# Patient Record
Sex: Female | Born: 1970 | Race: Black or African American | Hispanic: No | Marital: Married | Smoking: Never smoker
Health system: Southern US, Community
[De-identification: ages and names within clinical notes are randomized; demographics above are authoritative.]

## PROBLEM LIST (undated history)

## (undated) DIAGNOSIS — I10 Essential (primary) hypertension: Secondary | ICD-10-CM

## (undated) HISTORY — PX: ABDOMINAL HYSTERECTOMY: SHX81

## (undated) HISTORY — PX: TOTAL ABDOMINAL HYSTERECTOMY: SHX209

---

## 1998-04-16 ENCOUNTER — Ambulatory Visit (HOSPITAL_COMMUNITY): Admission: RE | Admit: 1998-04-16 | Discharge: 1998-04-16 | Payer: Self-pay | Admitting: Obstetrics and Gynecology

## 2000-01-28 ENCOUNTER — Emergency Department (HOSPITAL_COMMUNITY): Admission: EM | Admit: 2000-01-28 | Discharge: 2000-01-28 | Payer: Self-pay | Admitting: Emergency Medicine

## 2000-03-16 ENCOUNTER — Other Ambulatory Visit: Admission: RE | Admit: 2000-03-16 | Discharge: 2000-03-16 | Payer: Self-pay | Admitting: Obstetrics and Gynecology

## 2001-05-07 ENCOUNTER — Other Ambulatory Visit: Admission: RE | Admit: 2001-05-07 | Discharge: 2001-05-07 | Payer: Self-pay | Admitting: Obstetrics and Gynecology

## 2001-12-27 ENCOUNTER — Emergency Department (HOSPITAL_COMMUNITY): Admission: EM | Admit: 2001-12-27 | Discharge: 2001-12-27 | Payer: Self-pay | Admitting: Emergency Medicine

## 2004-10-29 ENCOUNTER — Other Ambulatory Visit: Admission: RE | Admit: 2004-10-29 | Discharge: 2004-10-29 | Payer: Self-pay | Admitting: Gynecology

## 2004-10-31 ENCOUNTER — Emergency Department (HOSPITAL_COMMUNITY): Admission: EM | Admit: 2004-10-31 | Discharge: 2004-10-31 | Payer: Self-pay | Admitting: Emergency Medicine

## 2005-05-29 ENCOUNTER — Emergency Department (HOSPITAL_COMMUNITY): Admission: EM | Admit: 2005-05-29 | Discharge: 2005-05-29 | Payer: Self-pay | Admitting: Emergency Medicine

## 2005-09-12 ENCOUNTER — Other Ambulatory Visit: Admission: RE | Admit: 2005-09-12 | Discharge: 2005-09-12 | Payer: Self-pay | Admitting: Family Medicine

## 2005-11-01 ENCOUNTER — Other Ambulatory Visit: Admission: RE | Admit: 2005-11-01 | Discharge: 2005-11-01 | Payer: Self-pay | Admitting: Gynecology

## 2006-01-19 ENCOUNTER — Ambulatory Visit: Payer: Self-pay | Admitting: Oncology

## 2006-01-31 LAB — CBC WITH DIFFERENTIAL/PLATELET
BASO%: 0.5 % (ref 0.0–2.0)
Basophils Absolute: 0 10*3/uL (ref 0.0–0.1)
EOS%: 2.4 % (ref 0.0–7.0)
Eosinophils Absolute: 0.2 10*3/uL (ref 0.0–0.5)
HCT: 29.3 % — ABNORMAL LOW (ref 34.8–46.6)
HGB: 9.2 g/dL — ABNORMAL LOW (ref 11.6–15.9)
LYMPH%: 32.9 % (ref 14.0–48.0)
MCH: 22.7 pg — ABNORMAL LOW (ref 26.0–34.0)
MCHC: 31.3 g/dL — ABNORMAL LOW (ref 32.0–36.0)
MCV: 72.7 fL — ABNORMAL LOW (ref 81.0–101.0)
MONO#: 0.7 10*3/uL (ref 0.1–0.9)
MONO%: 10.1 % (ref 0.0–13.0)
NEUT#: 3.7 10*3/uL (ref 1.5–6.5)
NEUT%: 54.1 % (ref 39.6–76.8)
Platelets: 543 10*3/uL — ABNORMAL HIGH (ref 145–400)
RBC: 4.03 10*6/uL (ref 3.70–5.32)
RDW: 18.8 % — ABNORMAL HIGH (ref 11.3–14.5)
WBC: 6.8 10*3/uL (ref 3.9–10.0)
lymph#: 2.2 10*3/uL (ref 0.9–3.3)

## 2006-01-31 LAB — COMPREHENSIVE METABOLIC PANEL
ALT: 13 U/L (ref 0–40)
AST: 14 U/L (ref 0–37)
Albumin: 3.9 g/dL (ref 3.5–5.2)
Alkaline Phosphatase: 65 U/L (ref 39–117)
BUN: 11 mg/dL (ref 6–23)
CO2: 25 mEq/L (ref 19–32)
Calcium: 8.4 mg/dL (ref 8.4–10.5)
Chloride: 105 mEq/L (ref 96–112)
Creatinine, Ser: 0.7 mg/dL (ref 0.4–1.2)
Glucose, Bld: 113 mg/dL — ABNORMAL HIGH (ref 70–99)
Potassium: 4.2 mEq/L (ref 3.5–5.3)
Sodium: 138 mEq/L (ref 135–145)
Total Bilirubin: 0.3 mg/dL (ref 0.3–1.2)
Total Protein: 7 g/dL (ref 6.0–8.3)

## 2006-01-31 LAB — FERRITIN: Ferritin: 3 ng/mL — ABNORMAL LOW (ref 10–291)

## 2006-01-31 LAB — FOLATE: Folate: 6.4 ng/mL

## 2006-01-31 LAB — IRON AND TIBC
%SAT: 4 % — ABNORMAL LOW (ref 20–55)
Iron: 14 ug/dL — ABNORMAL LOW (ref 42–145)
TIBC: 395 ug/dL (ref 250–470)
UIBC: 381 ug/dL

## 2006-01-31 LAB — CHCC SMEAR

## 2006-03-07 ENCOUNTER — Ambulatory Visit: Payer: Self-pay | Admitting: Oncology

## 2006-03-09 ENCOUNTER — Ambulatory Visit: Payer: Self-pay | Admitting: Oncology

## 2006-03-15 LAB — CBC WITH DIFFERENTIAL/PLATELET
BASO%: 0.5 % (ref 0.0–2.0)
Basophils Absolute: 0 10*3/uL (ref 0.0–0.1)
EOS%: 2.5 % (ref 0.0–7.0)
Eosinophils Absolute: 0.2 10*3/uL (ref 0.0–0.5)
HCT: 37.3 % (ref 34.8–46.6)
HGB: 12 g/dL (ref 11.6–15.9)
LYMPH%: 33.5 % (ref 14.0–48.0)
MCH: 25.9 pg — ABNORMAL LOW (ref 26.0–34.0)
MCHC: 32.2 g/dL (ref 32.0–36.0)
MCV: 80.3 fL — ABNORMAL LOW (ref 81.0–101.0)
MONO#: 0.6 10*3/uL (ref 0.1–0.9)
MONO%: 9.2 % (ref 0.0–13.0)
NEUT#: 3.7 10*3/uL (ref 1.5–6.5)
NEUT%: 54.3 % (ref 39.6–76.8)
Platelets: 455 10*3/uL — ABNORMAL HIGH (ref 145–400)
RBC: 4.64 10*6/uL (ref 3.70–5.32)
RDW: 27.9 % — ABNORMAL HIGH (ref 11.3–14.5)
WBC: 6.8 10*3/uL (ref 3.9–10.0)
lymph#: 2.3 10*3/uL (ref 0.9–3.3)

## 2006-03-15 LAB — FERRITIN: Ferritin: 69 ng/mL (ref 10–291)

## 2006-03-15 LAB — IRON AND TIBC
%SAT: 20 % (ref 20–55)
Iron: 71 ug/dL (ref 42–145)
TIBC: 360 ug/dL (ref 250–470)
UIBC: 289 ug/dL

## 2006-04-27 ENCOUNTER — Encounter (INDEPENDENT_AMBULATORY_CARE_PROVIDER_SITE_OTHER): Payer: Self-pay | Admitting: Specialist

## 2006-04-27 ENCOUNTER — Inpatient Hospital Stay (HOSPITAL_COMMUNITY): Admission: RE | Admit: 2006-04-27 | Discharge: 2006-04-29 | Payer: Self-pay | Admitting: Obstetrics

## 2009-06-18 ENCOUNTER — Emergency Department (HOSPITAL_BASED_OUTPATIENT_CLINIC_OR_DEPARTMENT_OTHER): Admission: EM | Admit: 2009-06-18 | Discharge: 2009-06-19 | Payer: Self-pay | Admitting: Emergency Medicine

## 2009-06-25 ENCOUNTER — Emergency Department (HOSPITAL_BASED_OUTPATIENT_CLINIC_OR_DEPARTMENT_OTHER): Admission: EM | Admit: 2009-06-25 | Discharge: 2009-06-25 | Payer: Self-pay | Admitting: Emergency Medicine

## 2009-11-01 ENCOUNTER — Observation Stay (HOSPITAL_COMMUNITY): Admission: EM | Admit: 2009-11-01 | Discharge: 2009-11-01 | Payer: Self-pay | Admitting: Emergency Medicine

## 2011-01-21 LAB — CBC
HCT: 41.7 % (ref 36.0–46.0)
Hemoglobin: 13.9 g/dL (ref 12.0–15.0)
MCHC: 33.4 g/dL (ref 30.0–36.0)
MCV: 101.4 fL — ABNORMAL HIGH (ref 78.0–100.0)
Platelets: 328 10*3/uL (ref 150–400)
RBC: 4.11 MIL/uL (ref 3.87–5.11)
RDW: 11.7 % (ref 11.5–15.5)
WBC: 9.4 10*3/uL (ref 4.0–10.5)

## 2011-01-21 LAB — BASIC METABOLIC PANEL
BUN: 10 mg/dL (ref 6–23)
CO2: 29 mEq/L (ref 19–32)
Calcium: 9 mg/dL (ref 8.4–10.5)
Chloride: 104 mEq/L (ref 96–112)
Creatinine, Ser: 0.8 mg/dL (ref 0.4–1.2)
GFR calc Af Amer: >60 mL/min (ref >60)
GFR calc non Af Amer: >60 mL/min (ref >60)
Glucose, Bld: 158 mg/dL — ABNORMAL HIGH (ref 70–99)
Potassium: 3.6 mEq/L (ref 3.5–5.1)
Sodium: 142 mEq/L (ref 135–145)

## 2011-01-21 LAB — URINE MICROSCOPIC-ADD ON

## 2011-01-21 LAB — URINALYSIS, ROUTINE W REFLEX MICROSCOPIC
Bilirubin Urine: NEGATIVE
Glucose, UA: NEGATIVE mg/dL
Hgb urine dipstick: NEGATIVE
Ketones, ur: NEGATIVE mg/dL
Leukocytes, UA: NEGATIVE
Nitrite: NEGATIVE
Protein, ur: 100 mg/dL — AB
Specific Gravity, Urine: 1.022 (ref 1.005–1.030)
Urobilinogen, UA: 0.2 mg/dL (ref 0.0–1.0)
pH: 6.5 (ref 5.0–8.0)

## 2011-01-21 LAB — PREGNANCY, URINE: Preg Test, Ur: NEGATIVE

## 2011-03-04 NOTE — Op Note (Signed)
Christy Cunningham, Christy Cunningham NO.:  [id number]   MEDICAL RECORD NO.:  [id number]          PATIENT TYPE:  INP   LOCATION:  9399                          FACILITY:  [facility]   PHYSICIAN:  Charles A. Clearance Coots, M.D.DATE OF BIRTH:  [birth year]   DATE OF PROCEDURE:  04/27/2006  DATE OF DISCHARGE:                                 OPERATIVE REPORT   PREOPERATIVE DIAGNOSIS:  Symptomatic uterine fibroids.   POSTOPERATIVE DIAGNOSIS:  Symptomatic uterine fibroids with pelvic  adhesions.   PROCEDURE:  Total abdominal hysterectomy, lysis of pelvic adhesions.   SURGEON:  Coral Ceo, M.D.   ASSISTANT:  Antionette Char, M.D.   ANESTHESIA:  General.   ESTIMATED BLOOD LOSS:  200 mL.   COMPLICATIONS:  None.   Foley to gravity.   FINDINGS:  Uterine fibroids, paratubal and paraovarian adhesions.   OPERATION:  The patient was brought to operating room after satisfactory  general endotracheal anesthesia, the abdomen was prepped and draped in the  usual sterile fashion.  Indwelling Foley catheter was placed.  A  Pfannenstiel skin incision was made through the previous incision with a  scalpel down to the fascia.  Fascia was nicked in the midline and the  fascial incision was extended to left and to right with curved Mayo  scissors.  The superior and inferior fascial edges were taken off of the  rectus muscles both blunt sharp dissection.  Rectus muscles bluntly and  sharply divided in midline.  Peritoneum was entered digitally and was then  sharply incised superiorly and inferiorly being careful to avoid the urinary  bladder inferiorly.  O'Connor-O'Sullivan retractor was then placed in the  incision and the bowel was packed off.  The anterior posterior blades were  placed.  The uterus was elevated and multiple adhesions were noted between  the right ovary and the sidewall of the uterus and the broad ligament.  The  right fallopian tube was also adhered to the appendix.  The same was  noted  on the left side. The adhesions were carefully incised and the ovary was  dropped down in its normal anatomic position.  Kelly forceps were then  placed across the utero-ovarian and broad ligaments on each side.  The round  ligaments were then grasped with Kelly forceps, cut and suture ligated with  transfixion sutures of 0 Vicryl bilaterally.  Anterior peritoneum was then  incised and undermined and the urinary bladder was dropped down and away  from the lower uterine segment and cervix sharply.  The broad ligament was  then tented digitally creating a window.  Parametrial clamp was placed  across the utero-ovarian and broad ligaments and pedicles were cut and  suture ligated with transfixion suture of 0 Vicryl after a free tie of 0  Vicryl was placed and the hemostasis was excellent.  The same procedure was  then performed on the opposite side without complications.  The uterine  vessels were then skeletonized and crossclamped with parametrial clamps,  curved along with a straight parametrial clamp above the curved clamp to  prevent back bleeding.  The vessels were cut and suture ligated  with  transfixion sutures of 0 Vicryl.  The same procedure was performed on the  opposite side without complications and the cardinal ligaments were then  serially clamped, cut and suture ligated with transfixion sutures of 0  Vicryl bilaterally down to the uterosacral ligaments.  The uterosacral  ligaments were clamped with parametrial clamp bilaterally, cut and suture  ligated with transfixion sutures of 0 Vicryl and these sutures were held  with hemostat for future closure of the cuff. The vaginal cuff was then  crossclamped with parametrial clamps and the cervix was then cut away from  the vaginal cuff entirely and submitted along with the uterus to pathology  for evaluation.  The center of the vaginal cuff was held with straight  Kocher clamps.  Each corner of the vaginal cuff was suture  ligated with  transfixion sutures of 0 Vicryl.  The center of the vaginal cuff was closed  with interrupted sutures of 0 Vicryl.  Hemostasis was excellent.  Pelvic  cavity was then thoroughly irrigated with warm saline solution.  All clots  were removed.  The abdomen was then closed after removal of the packing and  retractor.  The peritoneum was grasped with Kelly forceps and was closed  with continuous suture of 2-0 Vicryl.  Fascia was closed with continuous  suture of 0 PDS from each corner to the center.  Subcutaneous tissue was  thoroughly irrigated with warm saline solution and all areas of subcutaneous  bleeding were coagulated with the Bovie.  The skin was then closed with  continuous subcuticular suture of 3-0 Monocryl.  Sterile bandage was applied  to the incision closure.  Surgical technician indicated that all needle,  sponge and instrument counts were correct.  The patient tolerated procedure  well and was transported to recovery in satisfactory condition.      Charles A. Clearance Coots, M.D.  Electronically Signed     CAH/MEDQ  D:  04/27/2006  T:  04/27/2006  Job:  [id number]

## 2011-03-04 NOTE — Discharge Summary (Signed)
NAMESHASTA, CHINN NO.:  [id number]   MEDICAL RECORD NO.:  [id number]          PATIENT TYPE:  INP   LOCATION:  9307                          FACILITY:  [facility]   PHYSICIAN:  Charles A. Clearance Coots, M.D.DATE OF BIRTH:  06-06-1971   DATE OF ADMISSION:  04/27/2006  DATE OF DISCHARGE:  04/29/2006                                 DISCHARGE SUMMARY   ADMITTING DIAGNOSIS:  Symptomatic uterine fibroids.   DISCHARGE DIAGNOSIS:  Symptomatic uterine fibroids status post total  abdominal hysterectomy and lysis of adhesions.   REASON FOR ADMISSION:  A 40 year old black female G1 with history of heavy  periods for the past two years. Fibroids were revealed on ultrasound. The  patient also has a history of severe anemia with hemoglobin down to 7-8 g  requiring iron dextran transfusion in the past. She tried medical therapy  and had no success, and now was desirous of definitive surgical therapy.   PAST SURGICAL HISTORY:  Tubal ligation, tubal reversal.   ILLNESSES:  Gastroesophageal reflux.   MEDICATIONS:  Iron dextran, Prilosec.   ALLERGIES:  No known drug allergies.   SOCIAL HISTORY:  Married. Negative tobacco, alcohol or recreational drug  use.   FAMILY HISTORY:  Negative for gynecologic cancer.   REVIEW OF SYSTEMS:  Unremarkable except for lower abdominal cramping.   PHYSICAL EXAMINATION:  GENERAL:  Well-nourished, well-developed female in no  acute distress.  VITAL SIGNS:  She is afebrile, vital signs stable.  LUNGS:  Clear to auscultation bilaterally.  HEART:  Regular rate and rhythm.  ABDOMEN:  Soft, nontender.  PELVIC:  Normal external female genitalia. Vaginal mucosa normal. Cervix is  nulliparous. 0 lesions at discharge. Uterus is about 14 weeks size, mid  position. There are no adnexal masses or tenderness appreciated.   ADMITTING LABORATORY VALUES:  Hemoglobin 12, hematocrit 36, white blood cell  count 7300, platelets 420,000. Coags were within normal  limits.  Comprehensive metabolic panel was within normal limits. Urinalysis was  within normal limits.   HOSPITAL COURSE:  The patient underwent a total abdominal hysterectomy and  lysis of adhesions on April 27, 2006.  There were no intraoperative  complications. Postoperative course was uncomplicated. The patient was  discharged home on postop day #2 in good condition.   DISCHARGE LABORATORY VALUES:  Hemoglobin 12, hematocrit 36.   MEDICATIONS:  Tylox and ibuprofen were prescribed for pain.   Routine written instructions were given for discharge after hysterectomy.  The patient is to call the office for follow-up appointment in two weeks.      Charles A. Clearance Coots, M.D.  Electronically Signed     CAH/MEDQ  D:  06/08/2006  T:  06/08/2006  Job:  [id number]

## 2011-08-15 ENCOUNTER — Other Ambulatory Visit: Payer: Self-pay | Admitting: Obstetrics

## 2011-08-15 DIAGNOSIS — Z1231 Encounter for screening mammogram for malignant neoplasm of breast: Secondary | ICD-10-CM

## 2011-09-13 ENCOUNTER — Ambulatory Visit (HOSPITAL_COMMUNITY)
Admission: RE | Admit: 2011-09-13 | Discharge: 2011-09-13 | Disposition: A | Discharge status: Home / Self Care | Payer: BC Managed Care – PPO | Source: Ambulatory Visit | Attending: Obstetrics | Admitting: Obstetrics

## 2011-09-13 DIAGNOSIS — Z1231 Encounter for screening mammogram for malignant neoplasm of breast: Secondary | ICD-10-CM | POA: Insufficient documentation

## 2012-04-11 ENCOUNTER — Ambulatory Visit: Payer: BC Managed Care – PPO | Admitting: Family Medicine

## 2012-05-01 ENCOUNTER — Ambulatory Visit (INDEPENDENT_AMBULATORY_CARE_PROVIDER_SITE_OTHER): Payer: BC Managed Care – PPO | Admitting: Family Medicine

## 2012-05-01 ENCOUNTER — Encounter: Payer: Self-pay | Admitting: Family Medicine

## 2012-05-01 VITALS — BP 109/72 | HR 84 | Temp 98.2°F | Ht 65.5 in | Wt 184.0 lb

## 2012-05-01 DIAGNOSIS — K219 Gastro-esophageal reflux disease without esophagitis: Secondary | ICD-10-CM

## 2012-05-01 DIAGNOSIS — E119 Type 2 diabetes mellitus without complications: Secondary | ICD-10-CM

## 2012-05-01 MED ORDER — DEXLANSOPRAZOLE 60 MG PO CPDR: 60.0000 mg | DELAYED_RELEASE_CAPSULE | Freq: Every day | ORAL | Status: DC

## 2012-05-01 MED ORDER — SITAGLIPTIN PHOS-METFORMIN HCL 50-1000 MG PO TABS: 1.0000 | ORAL_TABLET | Freq: Two times a day (BID) | ORAL | Status: DC

## 2012-05-01 NOTE — Assessment & Plan Note (Signed)
If sx's continue despite meds, may need GI referral.

## 2012-05-01 NOTE — Assessment & Plan Note (Signed)
Doing excellent.  Will attempt to wean her meds in 3 months if she continues to have such good glycemic control, continued weight loss.

## 2012-05-01 NOTE — Progress Notes (Signed)
  Subjective:    Patient ID: Christy Cunningham, female    DOB: August 30, 1971, 41 y.o.   MRN: [id number]  HPI Here today as new pt.  She has been diabetic x 2 years and she has been seen by Dr. Sharl Ma.  She is on Janumet twice daily and she had recent Hgb A1C of 5.6.  She is following a diet and exercising to hopefully be able to come off her medications.  She is without complaint today and just needs to get established with a primary care MD who can manage her DM. Also, has problems with GERD, which have not improved with weight loss.  Was using prilosec OTC and failed that. Past Medical History  Diagnosis Date  . Diabetes mellitus    Past Surgical History  Procedure Date  . Abdominal hysterectomy    Current Outpatient Prescriptions on File Prior to Visit  Medication Sig Dispense Refill  . dexlansoprazole (DEXILANT) 60 MG capsule Take 1 capsule (60 mg total) by mouth daily.  30 capsule  5  . sitaGLIPtan-metformin (JANUMET) 50-1000 MG per tablet Take 1 tablet by mouth 2 (two) times daily with a meal.  60 tablet  5   No Known Allergies Family History  Problem Relation Age of Onset  . Diabetes Sister    History   Social History  . Marital Status: Married    Spouse Name: N/A    Number of Children: N/A  . Years of Education: N/A   Occupational History  . Not on file.   Social History Main Topics  . Smoking status: Never Smoker   . Smokeless tobacco: Not on file  . Alcohol Use: No  . Drug Use: No  . Sexually Active: Not on file   Other Topics Concern  . Not on file   Social History Narrative  . No narrative on file     Review of Systems  Constitutional: Negative for fever and chills.  HENT: Negative for nosebleeds, congestion and rhinorrhea.   Eyes: Negative for visual disturbance.  Respiratory: Negative for chest tightness and shortness of breath.   Cardiovascular: Negative for chest pain.  Gastrointestinal: Negative for nausea, vomiting, abdominal pain, diarrhea,  constipation, blood in stool and abdominal distention.  Genitourinary: Negative for dysuria, frequency, vaginal bleeding and menstrual problem.  Musculoskeletal: Negative for arthralgias.  Skin: Negative for rash.  Neurological: Negative for dizziness and headaches.  Psychiatric/Behavioral: Negative for behavioral problems, disturbed wake/sleep cycle and dysphoric mood.  All other systems reviewed and are negative.       Objective:   Physical Exam  Vitals reviewed. Constitutional: She is oriented to person, place, and time. She appears well-developed and well-nourished.  HENT:  Head: Normocephalic and atraumatic.  Eyes: No scleral icterus.  Neck: Normal range of motion. No thyromegaly present.  Cardiovascular: Normal rate and regular rhythm.   Pulmonary/Chest: Effort normal and breath sounds normal.  Abdominal: Soft. She exhibits no distension. There is no tenderness.  Neurological: She is alert and oriented to person, place, and time.  Skin: Skin is warm and dry.  Psychiatric: She has a normal mood and affect.          Assessment & Plan:

## 2012-05-01 NOTE — Patient Instructions (Signed)

## 2012-10-15 ENCOUNTER — Other Ambulatory Visit: Payer: Self-pay | Admitting: Family Medicine

## 2012-10-15 DIAGNOSIS — Z1231 Encounter for screening mammogram for malignant neoplasm of breast: Secondary | ICD-10-CM

## 2012-10-26 ENCOUNTER — Ambulatory Visit (HOSPITAL_COMMUNITY)
Admission: RE | Admit: 2012-10-26 | Discharge: 2012-10-26 | Disposition: A | Discharge status: Home / Self Care | Payer: BC Managed Care – PPO | Source: Ambulatory Visit | Attending: Family Medicine | Admitting: Family Medicine

## 2012-10-26 DIAGNOSIS — Z1231 Encounter for screening mammogram for malignant neoplasm of breast: Secondary | ICD-10-CM

## 2013-03-08 ENCOUNTER — Ambulatory Visit (INDEPENDENT_AMBULATORY_CARE_PROVIDER_SITE_OTHER): Payer: BC Managed Care – PPO | Admitting: Family Medicine

## 2013-03-08 ENCOUNTER — Encounter: Payer: Self-pay | Admitting: Family Medicine

## 2013-03-08 VITALS — BP 123/85 | HR 87 | Ht 67.5 in | Wt 164.0 lb

## 2013-03-08 DIAGNOSIS — E119 Type 2 diabetes mellitus without complications: Secondary | ICD-10-CM

## 2013-03-08 LAB — POCT GLYCOSYLATED HEMOGLOBIN (HGB A1C): Hemoglobin A1C: 5.6

## 2013-03-08 MED ORDER — METFORMIN HCL 1000 MG PO TABS: 1000.0000 mg | ORAL_TABLET | Freq: Two times a day (BID) | ORAL | Status: DC

## 2013-03-08 NOTE — Addendum Note (Signed)
Addended byArlyss Repress on: 03/08/2013 09:29 AM   Modules accepted: Orders

## 2013-03-08 NOTE — Assessment & Plan Note (Addendum)
To return for fasting blood work urine microalbumin, retinal scan today.  Continue current diet and exercise plan.  She will decrease her metformin to 1000mg  daily and watch her BS.  May increase if needed.  Her A1C is 5.6 today.

## 2013-03-08 NOTE — Patient Instructions (Addendum)

## 2013-03-08 NOTE — Progress Notes (Signed)
  Subjective:    Patient ID: Christy Cunningham, female    DOB: [birth year], 42 y.o.   MRN: [id number]  Diabetes Pertinent negatives for diabetes include no chest pain.    Doing well.  Down another 20 lbs.  She is running 5 mi/day 3days/wk.  Watching diet.  She continues on Metformin 2000 mg/day.  She is working long hours and not eating as much as she should.  She is not fasting today, but will come in for fasting lipids.  She will get a retina scan this am.  Review of Systems  Constitutional: Negative for fever and chills.  Respiratory: Negative for shortness of breath.   Cardiovascular: Negative for chest pain and leg swelling.  Gastrointestinal: Negative for nausea, vomiting, abdominal pain, diarrhea and constipation.       Objective:   Physical Exam  Vitals reviewed. Constitutional: She is oriented to person, place, and time. She appears well-developed and well-nourished. No distress.  HENT:  Head: Normocephalic and atraumatic.  Eyes: No scleral icterus.  Neck: Neck supple. Carotid bruit is not present. No thyromegaly present.  Cardiovascular: Normal rate and regular rhythm.   No murmur heard. Pulmonary/Chest: Effort normal and breath sounds normal. She has no wheezes.  Abdominal: Soft. There is no tenderness.  Musculoskeletal: Normal range of motion. She exhibits no tenderness.  Neurological: She is alert and oriented to person, place, and time.  Skin: Skin is warm and dry. No rash noted.  Psychiatric: She has a normal mood and affect.    glycosylated hemoglobin 5.6       Assessment & Plan:

## 2013-10-03 ENCOUNTER — Other Ambulatory Visit: Payer: BC Managed Care – PPO

## 2013-10-03 DIAGNOSIS — E119 Type 2 diabetes mellitus without complications: Secondary | ICD-10-CM

## 2013-10-03 LAB — COMPREHENSIVE METABOLIC PANEL
ALT: 24 U/L (ref 0–35)
AST: 21 U/L (ref 0–37)
Albumin: 4 g/dL (ref 3.5–5.2)
Alkaline Phosphatase: 57 U/L (ref 39–117)
BUN: 9 mg/dL (ref 6–23)
CO2: 30 mEq/L (ref 19–32)
Calcium: 8.7 mg/dL (ref 8.4–10.5)
Chloride: 101 mEq/L (ref 96–112)
Creat: 0.68 mg/dL (ref 0.50–1.10)
Glucose, Bld: 114 mg/dL — ABNORMAL HIGH (ref 70–99)
Potassium: 4 mEq/L (ref 3.5–5.3)
Sodium: 139 mEq/L (ref 135–145)
Total Bilirubin: 0.6 mg/dL (ref 0.3–1.2)
Total Protein: 7 g/dL (ref 6.0–8.3)

## 2013-10-03 LAB — LIPID PANEL
Cholesterol: 118 mg/dL (ref 0–200)
HDL: 53 mg/dL (ref >39)
LDL Cholesterol: 55 mg/dL (ref 0–99)
Total CHOL/HDL Ratio: 2.2 Ratio
Triglycerides: 51 mg/dL (ref <150)
VLDL: 10 mg/dL (ref 0–40)

## 2013-10-03 NOTE — Progress Notes (Signed)
CMP ,FLP AND MICRO URINE DONE TODAY Christy Cunningham

## 2013-10-04 LAB — MICROALBUMIN / CREATININE URINE RATIO
Creatinine, Urine: 49.5 mg/dL
Microalb Creat Ratio: 10.1 mg/g (ref 0.0–30.0)
Microalb, Ur: 0.5 mg/dL (ref 0.00–1.89)

## 2014-03-17 ENCOUNTER — Other Ambulatory Visit (INDEPENDENT_AMBULATORY_CARE_PROVIDER_SITE_OTHER): Payer: BC Managed Care – PPO | Admitting: *Deleted

## 2014-03-17 DIAGNOSIS — N390 Urinary tract infection, site not specified: Secondary | ICD-10-CM

## 2014-03-17 LAB — POCT URINALYSIS DIP (DEVICE)
Bilirubin Urine: NEGATIVE
Glucose, UA: NEGATIVE mg/dL
Ketones, ur: NEGATIVE mg/dL
Nitrite: POSITIVE — AB
Protein, ur: 30 mg/dL — AB
Specific Gravity, Urine: 1.02 (ref 1.005–1.030)
Urobilinogen, UA: 0.2 mg/dL (ref 0.0–1.0)
pH: 6.5 (ref 5.0–8.0)

## 2014-03-17 MED ORDER — PHENAZOPYRIDINE HCL 200 MG PO TABS: 200.0000 mg | ORAL_TABLET | Freq: Three times a day (TID) | ORAL | Status: DC | PRN

## 2014-03-17 MED ORDER — NITROFURANTOIN MONOHYD MACRO 100 MG PO CAPS: 100.0000 mg | ORAL_CAPSULE | Freq: Two times a day (BID) | ORAL | Status: DC

## 2014-03-17 NOTE — Progress Notes (Signed)
Pt dropped of urine for urinalysis. Patient is symptomatic with dysuria. Tried Azo Standard and it is not helping. Dr. Reola Calkins gave order for Macrobid bid x7days. Also sent in Pyridium per protocol. Patient aware. Urine sent for culture.

## 2014-03-19 LAB — URINE CULTURE: Colony Count: >=100000

## 2014-04-01 ENCOUNTER — Encounter: Payer: Self-pay | Admitting: Family Medicine

## 2014-04-01 ENCOUNTER — Ambulatory Visit (INDEPENDENT_AMBULATORY_CARE_PROVIDER_SITE_OTHER): Payer: BC Managed Care – PPO | Admitting: Family Medicine

## 2014-04-01 VITALS — BP 125/82 | HR 76 | Temp 97.9°F | Wt 189.0 lb

## 2014-04-01 DIAGNOSIS — E119 Type 2 diabetes mellitus without complications: Secondary | ICD-10-CM

## 2014-04-01 DIAGNOSIS — G43909 Migraine, unspecified, not intractable, without status migrainosus: Secondary | ICD-10-CM

## 2014-04-01 LAB — LDL CHOLESTEROL, DIRECT: Direct LDL: 71 mg/dL

## 2014-04-01 LAB — BASIC METABOLIC PANEL
BUN: 9 mg/dL (ref 6–23)
CO2: 26 mEq/L (ref 19–32)
Calcium: 8.9 mg/dL (ref 8.4–10.5)
Chloride: 100 mEq/L (ref 96–112)
Creat: 0.71 mg/dL (ref 0.50–1.10)
Glucose, Bld: 127 mg/dL — ABNORMAL HIGH (ref 70–99)
Potassium: 4.3 mEq/L (ref 3.5–5.3)
Sodium: 136 mEq/L (ref 135–145)

## 2014-04-01 LAB — POCT GLYCOSYLATED HEMOGLOBIN (HGB A1C): Hemoglobin A1C: 6

## 2014-04-01 MED ORDER — METFORMIN HCL ER 500 MG PO TB24: 1000.0000 mg | ORAL_TABLET | Freq: Every day | ORAL | Status: DC

## 2014-04-01 NOTE — Assessment & Plan Note (Addendum)
A:  R sided headaches consistent with migraines.   P:  I  agree that regular exercise, healthy eating and regular sleep and work cycle is best preventative therapy.

## 2014-04-01 NOTE — Progress Notes (Signed)
   Subjective:    Patient ID: Christy Cunningham, female    DOB: 01/11/1971, 43 y.o.   MRN: [id number] CC: DM2 f/u and migraine  HPI 43 yo F presents for SD visit for the following:  1. DM2: compliant with metformin. Feeling fatigued. Would like to switch to XR. Interested in additional medication to help with weight loss if A1c not at goal. Denies vision changes, polydipsia, polyuria, tingling/numbness in extremities.   2. Migraine HA: R sided. None today. Increase frequency. Admits to stress, poor diet, lack of exercise. Take OTC NSAID migraine medication which relieves symptoms.   Soc hx: non smoker  Review of Systems As per HPI      Objective:   Physical Exam BP 125/82  Pulse 76  Temp(Src) 97.9 F (36.6 C) (Oral)  Wt 189 lb (85.73 kg) General appearance: alert, cooperative and no distress Head: Normocephalic, without obvious abnormality, atraumatic Eyes: conjunctivae/corneas clear. PERRL, EOM's intact.  Lungs: clear to auscultation bilaterally Heart: regular rate and rhythm, S1, S2 normal, no murmur, click, rub or gallop Extremities: extremities normal, atraumatic, no cyanosis or edema  Lab Results  Component Value Date   HGBA1C 6.0 04/01/2014        Assessment & Plan:

## 2014-04-01 NOTE — Patient Instructions (Signed)
Christy Cunningham,  Thank you for coming in today.  1. DM2: A1c at goal I have changed your metformin to XR 500 mg tablets, two nightly.  Get back into regular exercise.  You will be contacted with lab results.   2. Migraines:  I agree that regular exercise, healthy eating and regular sleep and work cycle is best preventative therapy.   Recommend f/u in 6 months for well controlled DM2. F/u sooner with Dr. Shawnie PonsPratt if migraines persist.   Dr. Armen PickupFunches

## 2014-04-01 NOTE — Assessment & Plan Note (Signed)
A1c at goal I have changed your metformin to XR 500 mg tablets, two nightly.  Get back into regular exercise.  You will be contacted with lab results- ACR, dLDL, BMP

## 2014-04-02 LAB — MICROALBUMIN / CREATININE URINE RATIO
Creatinine, Urine: 99.4 mg/dL
Microalb Creat Ratio: 5 mg/g (ref 0.0–30.0)
Microalb, Ur: 0.5 mg/dL (ref 0.00–1.89)

## 2014-04-03 ENCOUNTER — Encounter: Payer: Self-pay | Admitting: Family Medicine

## 2014-04-14 ENCOUNTER — Telehealth: Payer: Self-pay | Admitting: Family Medicine

## 2014-04-14 NOTE — Telephone Encounter (Signed)
Patient informed. She will make appointment.

## 2014-04-14 NOTE — Telephone Encounter (Signed)
I would prefer her come in for discussion of medication

## 2014-07-30 ENCOUNTER — Other Ambulatory Visit: Payer: Self-pay | Admitting: *Deleted

## 2014-07-30 DIAGNOSIS — E119 Type 2 diabetes mellitus without complications: Secondary | ICD-10-CM

## 2014-07-30 MED ORDER — METFORMIN HCL ER 500 MG PO TB24
1000.0000 mg | ORAL_TABLET | Freq: Two times a day (BID) | ORAL | Status: DC
Start: 2014-07-30 — End: 2014-10-22

## 2014-07-30 MED ORDER — METFORMIN HCL ER 500 MG PO TB24: 1000.0000 mg | ORAL_TABLET | Freq: Every day | ORAL | Status: DC

## 2014-07-30 NOTE — Telephone Encounter (Signed)
Pt needs a refill on this medication.  She has been taking 1,000mg  BID of metformin before seeing Dr. Armen PickupFunches and then it was changed to 500mg  BID.  She has been using up her refills quickly due to taking 4 pills a day of her current rx.  Can this be changed back?  Pt plans to make an appt at your next available day. Taiki Buckwalter,CMA

## 2014-07-30 NOTE — Addendum Note (Signed)
Addended by: Reva BoresPRATT, TANYA S on: 07/30/2014 03:39 PM   Modules accepted: Orders

## 2014-09-04 ENCOUNTER — Ambulatory Visit: Payer: BC Managed Care – PPO | Admitting: Family Medicine

## 2014-10-01 ENCOUNTER — Encounter: Payer: Self-pay | Admitting: *Deleted

## 2014-10-01 NOTE — Progress Notes (Signed)
Pt called clinic with symptoms of yeast infection, Diflucan called into pharmacy per standing protocol orders.

## 2014-10-22 ENCOUNTER — Ambulatory Visit (INDEPENDENT_AMBULATORY_CARE_PROVIDER_SITE_OTHER): Payer: BC Managed Care – PPO | Admitting: Family Medicine

## 2014-10-22 ENCOUNTER — Encounter: Payer: Self-pay | Admitting: Family Medicine

## 2014-10-22 VITALS — BP 111/75 | HR 77 | Temp 97.8°F | Ht 68.0 in | Wt 190.0 lb

## 2014-10-22 DIAGNOSIS — B3731 Acute candidiasis of vulva and vagina: Secondary | ICD-10-CM

## 2014-10-22 DIAGNOSIS — E663 Overweight: Secondary | ICD-10-CM

## 2014-10-22 DIAGNOSIS — B373 Candidiasis of vulva and vagina: Secondary | ICD-10-CM

## 2014-10-22 DIAGNOSIS — E559 Vitamin D deficiency, unspecified: Secondary | ICD-10-CM | POA: Diagnosis not present

## 2014-10-22 DIAGNOSIS — E119 Type 2 diabetes mellitus without complications: Secondary | ICD-10-CM | POA: Diagnosis not present

## 2014-10-22 LAB — POCT URINALYSIS DIPSTICK
Bilirubin, UA: NEGATIVE
Blood, UA: NEGATIVE
Glucose, UA: 500
Ketones, UA: >=160
Leukocytes, UA: NEGATIVE
Nitrite, UA: NEGATIVE
Protein, UA: NEGATIVE
Spec Grav, UA: 1.02
Urobilinogen, UA: 0.2
pH, UA: 6

## 2014-10-22 LAB — POCT GLYCOSYLATED HEMOGLOBIN (HGB A1C): Hemoglobin A1C: 8.9

## 2014-10-22 MED ORDER — NYSTATIN 100000 UNIT/GM EX CREA: 1.0000 "application " | TOPICAL_CREAM | Freq: Two times a day (BID) | CUTANEOUS | Status: DC

## 2014-10-22 MED ORDER — FLUCONAZOLE 150 MG PO TABS: 150.0000 mg | ORAL_TABLET | Freq: Every day | ORAL | Status: DC

## 2014-10-22 MED ORDER — SITAGLIPTIN PHOS-METFORMIN HCL 50-1000 MG PO TABS: 1.0000 | ORAL_TABLET | Freq: Two times a day (BID) | ORAL | Status: DC

## 2014-10-22 MED ORDER — PHENTERMINE HCL 37.5 MG PO CAPS: 37.5000 mg | ORAL_CAPSULE | ORAL | Status: DC

## 2014-10-22 MED ORDER — VITAMIN D3 1.25 MG (50000 UT) PO CAPS: 50000.0000 [IU] | ORAL_CAPSULE | ORAL | Status: DC

## 2014-10-22 NOTE — Patient Instructions (Addendum)
Basic Carbohydrate Counting for Diabetes Mellitus Carbohydrate counting is a method for keeping track of the amount of carbohydrates you eat. Eating carbohydrates naturally increases the level of sugar (glucose) in your blood, so it is important for you to know the amount that is okay for you to have in every meal. Carbohydrate counting helps keep the level of glucose in your blood within normal limits. The amount of carbohydrates allowed is different for every person. A dietitian can help you calculate the amount that is right for you. Once you know the amount of carbohydrates you can have, you can count the carbohydrates in the foods you want to eat. Carbohydrates are found in the following foods:  Grains, such as breads and cereals.  Dried beans and soy products.  Starchy vegetables, such as potatoes, peas, and corn.  Fruit and fruit juices.  Milk and yogurt.  Sweets and snack foods, such as cake, cookies, candy, chips, soft drinks, and fruit drinks. CARBOHYDRATE COUNTING There are two ways to count the carbohydrates in your food. You can use either of the methods or a combination of both. Reading the "Nutrition Facts" on Packaged Food The "Nutrition Facts" is an area that is included on the labels of almost all packaged food and beverages in the [place]. It includes the serving size of that food or beverage and information about the nutrients in each serving of the food, including the grams (g) of carbohydrate per serving.  Decide the number of servings of this food or beverage that you will be able to eat or drink. Multiply that number of servings by the number of grams of carbohydrate that is listed on the label for that serving. The total will be the amount of carbohydrates you will be having when you eat or drink this food or beverage. Learning Standard Serving Sizes of Food When you eat food that is not packaged or does not include "Nutrition Facts" on the label, you need to  measure the servings in order to count the amount of carbohydrates.A serving of most carbohydrate-rich foods contains about 15 g of carbohydrates. The following list includes serving sizes of carbohydrate-rich foods that provide 15 g ofcarbohydrate per serving:   1 slice of bread (1 oz) or 1 six-inch tortilla.    of a hamburger bun or English muffin.  4-6 crackers.   cup unsweetened dry cereal.    cup hot cereal.   cup rice or pasta.    cup mashed potatoes or  of a large baked potato.  1 cup fresh fruit or one small piece of fruit.    cup canned or frozen fruit or fruit juice.  1 cup milk.   cup plain fat-free yogurt or yogurt sweetened with artificial sweeteners.   cup cooked dried beans or starchy vegetable, such as peas, corn, or potatoes.  Decide the number of standard-size servings that you will eat. Multiply that number of servings by 15 (the grams of carbohydrates in that serving). For example, if you eat 2 cups of strawberries, you will have eaten 2 servings and 30 g of carbohydrates (2 servings x 15 g = 30 g). For foods such as soups and casseroles, in which more than one food is mixed in, you will need to count the carbohydrates in each food that is included. EXAMPLE OF CARBOHYDRATE COUNTING Sample Dinner  3 oz chicken breast.   cup of brown rice.   cup of corn.  1 cup milk.   1 cup strawberries with   sugar-free whipped topping.  Carbohydrate Calculation Step 1: Identify the foods that contain carbohydrates:   Rice.   Corn.   Milk.   Strawberries. Step 2:Calculate the number of servings eaten of each:   2 servings of rice.   1 serving of corn.   1 serving of milk.   1 serving of strawberries. Step 3: Multiply each of those number of servings by 15 g:   2 servings of rice x 15 g = 30 g.   1 serving of corn x 15 g = 15 g.   1 serving of milk x 15 g = 15 g.   1 serving of strawberries x 15 g = 15 g. Step 4: Add  together all of the amounts to find the total grams of carbohydrates eaten: 30 g + 15 g + 15 g + 15 g = 75 g. Document Released: 10/03/2005 Document Revised: 02/17/2014 Document Reviewed: 08/30/2013 ExitCare Patient Information 2015 [employer]. This information is not intended to replace advice given to you by your health care provider. Make sure you discuss any questions you have with your health care provider. Diabetes and Exercise Exercising regularly is important. It is not just about losing weight. It has many health benefits, such as:  Improving your overall fitness, flexibility, and endurance.  Increasing your bone density.  Helping with weight control.  Decreasing your body fat.  Increasing your muscle strength.  Reducing stress and tension.  Improving your overall health. People with diabetes who exercise gain additional benefits because exercise:  Reduces appetite.  Improves the body's use of blood sugar (glucose).  Helps lower or control blood glucose.  Decreases blood pressure.  Helps control blood lipids (such as cholesterol and triglycerides).  Improves the body's use of the hormone insulin by:  Increasing the body's insulin sensitivity.  Reducing the body's insulin needs.  Decreases the risk for heart disease because exercising:  Lowers cholesterol and triglycerides levels.  Increases the levels of good cholesterol (such as high-density lipoproteins [HDL]) in the body.  Lowers blood glucose levels. YOUR ACTIVITY PLAN  Choose an activity that you enjoy and set realistic goals. Your health care provider or diabetes educator can help you make an activity plan that works for you. Exercise regularly as directed by your health care provider. This includes:  Performing resistance training twice a week such as push-ups, sit-ups, lifting weights, or using resistance bands.  Performing 150 minutes of cardio exercises each week such as walking, running, or  playing sports.  Staying active and spending no more than 90 minutes at one time being inactive. Even short bursts of exercise are good for you. Three 10-minute sessions spread throughout the day are just as beneficial as a single 30-minute session. Some exercise ideas include:  Taking the dog for a walk.  Taking the stairs instead of the elevator.  Dancing to your favorite song.  Doing an exercise video.  Doing your favorite exercise with a friend. RECOMMENDATIONS FOR EXERCISING WITH TYPE 1 OR TYPE 2 DIABETES   Check your blood glucose before exercising. If blood glucose levels are greater than 240 mg/dL, check for urine ketones. Do not exercise if ketones are present.  Avoid injecting insulin into areas of the body that are going to be exercised. For example, avoid injecting insulin into:  The arms when playing tennis.  The legs when jogging.  Keep a record of:  Food intake before and after you exercise.  Expected peak times of insulin action.  Blood   glucose levels before and after you exercise.  The type and amount of exercise you have done.  Review your records with your health care provider. Your health care provider will help you to develop guidelines for adjusting food intake and insulin amounts before and after exercising.  If you take insulin or oral hypoglycemic agents, watch for signs and symptoms of hypoglycemia. They include:  Dizziness.  Shaking.  Sweating.  Chills.  Confusion.  Drink plenty of water while you exercise to prevent dehydration or heat stroke. Body water is lost during exercise and must be replaced.  Talk to your health care provider before starting an exercise program to make sure it is safe for you. Remember, almost any type of activity is better than none. Document Released: 12/24/2003 Document Revised: 02/17/2014 Document Reviewed: 03/12/2013 ExitCare Patient Information 2015 [employer]. This information is not intended to  replace advice given to you by your health care provider. Make sure you discuss any questions you have with your health care provider.  

## 2014-10-22 NOTE — Assessment & Plan Note (Signed)
Worsening control--have added back Januvia at previous dose.  To work on diet and exercise.

## 2014-10-22 NOTE — Assessment & Plan Note (Signed)
8 wks of aggressive repletion with f/u lab--then, may be on 800 u daily

## 2014-10-22 NOTE — Progress Notes (Signed)
   Subjective:    Patient ID: Christy Cunningham is a 44 y.o. female presenting with No chief complaint on file.  on 10/22/2014  HPI: Reports elevated BS.  Has yeast in the vulva.  Lacks willpower and is no longer exercising.  Notes increase stress at work.  Review of Systems  Constitutional: Negative for fever and chills.  Respiratory: Negative for shortness of breath.   Cardiovascular: Negative for chest pain.  Gastrointestinal: Negative for nausea, vomiting and abdominal pain.  Endocrine: Positive for polydipsia and polyuria.  Genitourinary: Positive for vaginal discharge and vaginal pain (itching). Negative for dysuria.  Skin: Negative for rash.  Psychiatric/Behavioral: Positive for dysphoric mood and decreased concentration.      Objective:    BP 111/75 mmHg  Pulse 77  Temp(Src) 97.8 F (36.6 C) (Oral)  Ht 5\' 8"  (1.727 m)  Wt 190 lb (86.183 kg)  BMI 28.90 kg/m2 Physical Exam  Constitutional: She is oriented to person, place, and time. She appears well-developed and well-nourished. No distress.  HENT:  Head: Normocephalic and atraumatic.  Eyes: No scleral icterus.  Neck: Neck supple.  Cardiovascular: Normal rate.   Pulmonary/Chest: Effort normal.  Abdominal: Soft.  Neurological: She is alert and oriented to person, place, and time.  Skin: Skin is warm and dry.  Psychiatric: She has a normal mood and affect.   HgbA1C-8.9 Vitamin D level is 12.5     Assessment & Plan:   Problem List Items Addressed This Visit      Unprioritized   Diabetes mellitus - Primary    Worsening control--have added back Januvia at previous dose.  To work on diet and exercise.    Relevant Medications      JANUMET 50-1000 MG PO TABS   Other Relevant Orders      POCT A1C (Completed)      Urinalysis Dipstick (Completed)   Vitamin D deficiency    8 wks of aggressive repletion with f/u lab--then, may be on 800 u daily    Relevant Medications      Cholecalciferol (VITAMIN D3) 50000 UNITS  CAPS    Other Visit Diagnoses    Vulvar candidiasis        Diflucan + Nystatin--should improve with glycemic control    Relevant Medications       fluconazole (DIFLUCAN) tablet 150 mg       Nystatin cream    Overweight (BMI 25.0-29.9)        Trial of phentermine to help with appetite suppression and kick start weight loss    Relevant Medications       phentermine capsule       Return in about 3 months (around 01/21/2015).

## 2014-12-15 ENCOUNTER — Encounter (HOSPITAL_BASED_OUTPATIENT_CLINIC_OR_DEPARTMENT_OTHER): Payer: Self-pay | Admitting: Emergency Medicine

## 2014-12-15 ENCOUNTER — Emergency Department (HOSPITAL_BASED_OUTPATIENT_CLINIC_OR_DEPARTMENT_OTHER)
Admission: EM | Admit: 2014-12-15 | Discharge: 2014-12-15 | Disposition: A | Discharge status: Home / Self Care | Payer: BLUE CROSS/BLUE SHIELD | Attending: Emergency Medicine | Admitting: Emergency Medicine

## 2014-12-15 DIAGNOSIS — E119 Type 2 diabetes mellitus without complications: Secondary | ICD-10-CM | POA: Insufficient documentation

## 2014-12-15 DIAGNOSIS — L988 Other specified disorders of the skin and subcutaneous tissue: Secondary | ICD-10-CM | POA: Diagnosis present

## 2014-12-15 DIAGNOSIS — Z79899 Other long term (current) drug therapy: Secondary | ICD-10-CM | POA: Insufficient documentation

## 2014-12-15 DIAGNOSIS — R238 Other skin changes: Secondary | ICD-10-CM

## 2014-12-15 NOTE — ED Notes (Signed)
Pt has blisters to both heels due to running marathons.  Pt asking to have them popped.  Some pain with wearing shoes.

## 2014-12-15 NOTE — ED Notes (Signed)
PA at bedside.

## 2014-12-15 NOTE — ED Provider Notes (Signed)
CSN: [id number]     Arrival date & time 12/15/14  1726 History   First MD Initiated Contact with Patient 12/15/14 1744     Chief Complaint  Patient presents with  . Blister     (Consider location/radiation/quality/duration/timing/severity/associated sxs/prior Treatment) Patient is a 44 y.o. female presenting with lower extremity pain. The history is provided by the patient. No language interpreter was used.  Foot Pain This is a new problem. The current episode started in the past 7 days. The problem occurs constantly. The problem has been gradually worsening. Pertinent negatives include no sore throat. Nothing aggravates the symptoms. She has tried nothing for the symptoms. The treatment provided moderate relief.  Pt reports she blistered heels running. Pt is diabetic.  Pt complains of pressure from blisters.  Pt wants areas drained  Past Medical History  Diagnosis Date  . Diabetes mellitus    Past Surgical History  Procedure Laterality Date  . Abdominal hysterectomy     Family History  Problem Relation Age of Onset  . Diabetes Sister    History  Substance Use Topics  . Smoking status: Never Smoker   . Smokeless tobacco: Not on file  . Alcohol Use: No   OB History    No data available     Review of Systems  HENT: Negative for sore throat.   All other systems reviewed and are negative.     Allergies  Review of patient's allergies indicates no known allergies.  Home Medications   Prior to Admission medications   Medication Sig Start Date End Date Taking? Authorizing Provider  Cholecalciferol (VITAMIN D3) 50000 UNITS CAPS Take 50,000 Units by mouth once a week. 10/22/14   Reva Boresanya S Pratt, MD  dexlansoprazole (DEXILANT) 60 MG capsule Take 1 capsule (60 mg total) by mouth daily. 05/01/12   Reva Boresanya S Pratt, MD  phentermine 37.5 MG capsule Take 1 capsule (37.5 mg total) by mouth every morning. 10/22/14   Reva Boresanya S Pratt, MD  sitaGLIPtin-metformin (JANUMET) 50-1000 MG per tablet  Take 1 tablet by mouth 2 (two) times daily with a meal. 10/22/14   Reva Boresanya S Pratt, MD   BP 121/70 mmHg  Pulse 66  Temp(Src) 98.4 F (36.9 C)  Resp 18  Ht 5\' 7"  (1.702 m)  Wt 193 lb (87.544 kg)  BMI 30.22 kg/m2  SpO2 100% Physical Exam  Musculoskeletal: She exhibits tenderness.  2cm blisters bilat feet outer aspects,   Neurological: She is alert.  Skin: Skin is warm.  Psychiatric: She has a normal mood and affect.    ED Course  Procedures (including critical care time) Labs Review Labs Reviewed - No data to display  Imaging Review No results found.   EKG Interpretation None      MDM  Pt counseled on blisters.  I advised to leave intact.    Final diagnoses:  Blisters of multiple sites        Elson AreasLeslie K Angelisse Riso, [place] JerseyPA-C 12/15/14 2309  Purvis SheffieldForrest Harrison, MD 12/16/14 1014

## 2014-12-15 NOTE — Discharge Instructions (Signed)
    Blisters Blisters are fluid-filled sacs that form within the skin. Common causes of blistering are friction, burns, and exposure to irritating chemicals. The fluid in the blister protects the underlying damaged skin. Most of the time it is not recommended that you open blisters. When a blister is opened, there is an increased chance for infection. Usually, a blister will open on its own. They then dry up and peel off within 10 days. If the blister is tense and uncomfortable (painful) the fluid may be drained. If it is drained the roof of the blister should be left intact. The draining should only be done by a medical professional under aseptic conditions. Poorly fitting shoes and boots can cause blisters by being too tight or too loose. Wearing extra socks or using tape, bandages, or pads over the blister-prone area helps prevent the problem by reducing friction. Blisters heal more slowly if you have diabetes or if you have problems with your circulation. You need to be careful about medical follow-up to prevent infection. HOME CARE INSTRUCTIONS  Protect areas where blisters have formed until the skin is healed. Use a special bandage with a hole cut in the middle around the blister. This reduces pressure and friction. When the blister breaks, trim off the loose skin and keep the area clean by washing it with soap daily. Soaking the blister or broken-open blister with diluted vinegar twice daily for 15 minutes will dry it up and speed the healing. Use 3 tablespoons of white vinegar per quart of water (45 mL white vinegar per liter of water). An antibiotic ointment and a bandage can be used to cover the area after soaking.  SEEK MEDICAL CARE IF:   You develop increased redness, pain, swelling, or drainage in the blistered area.  You develop a pus-like discharge from the blistered area, chills, or a fever. MAKE SURE YOU:   Understand these instructions.  Will watch your condition.  Will get help  right away if you are not doing well or get worse. Document Released: 11/10/2004 Document Revised: 12/26/2011 Document Reviewed: 10/08/2008 ExitCare Patient Information 2015 [employer]. This information is not intended to replace advice given to you by your health care provider. Make sure you discuss any questions you have with your health care provider.  

## 2014-12-15 NOTE — ED Notes (Signed)
Ginger ale offered for pt and family per request.  Pt ambulatory to restroom without difficulty.

## 2015-02-25 ENCOUNTER — Other Ambulatory Visit: Payer: Self-pay | Admitting: *Deleted

## 2015-02-25 DIAGNOSIS — E119 Type 2 diabetes mellitus without complications: Secondary | ICD-10-CM

## 2015-02-25 MED ORDER — SITAGLIPTIN PHOS-METFORMIN HCL 50-1000 MG PO TABS: 1.0000 | ORAL_TABLET | Freq: Two times a day (BID) | ORAL | Status: DC

## 2015-02-25 NOTE — Progress Notes (Signed)
Received call from patient with request for refill on diabetes medication until can be seen by Dr. Shawnie PonsPratt later in the month.  Refill sent to pharmacy.

## 2015-03-11 ENCOUNTER — Encounter: Payer: Self-pay | Admitting: Family Medicine

## 2015-03-11 ENCOUNTER — Ambulatory Visit (INDEPENDENT_AMBULATORY_CARE_PROVIDER_SITE_OTHER): Payer: BLUE CROSS/BLUE SHIELD | Admitting: Family Medicine

## 2015-03-11 VITALS — BP 121/78 | HR 76 | Temp 98.0°F | Ht 67.0 in | Wt 193.5 lb

## 2015-03-11 DIAGNOSIS — E119 Type 2 diabetes mellitus without complications: Secondary | ICD-10-CM | POA: Diagnosis not present

## 2015-03-11 DIAGNOSIS — E663 Overweight: Secondary | ICD-10-CM | POA: Diagnosis not present

## 2015-03-11 DIAGNOSIS — E559 Vitamin D deficiency, unspecified: Secondary | ICD-10-CM | POA: Diagnosis not present

## 2015-03-11 LAB — POCT GLYCOSYLATED HEMOGLOBIN (HGB A1C): Hemoglobin A1C: 6.9

## 2015-03-11 MED ORDER — PHENTERMINE HCL 37.5 MG PO CAPS: 37.5000 mg | ORAL_CAPSULE | ORAL | Status: DC

## 2015-03-11 MED ORDER — SITAGLIPTIN PHOS-METFORMIN HCL 50-1000 MG PO TABS: 1.0000 | ORAL_TABLET | Freq: Two times a day (BID) | ORAL | Status: DC

## 2015-03-11 NOTE — Patient Instructions (Signed)
Diabetes and Exercise Exercising regularly is important. It is not just about losing weight. It has many health benefits, such as:  Improving your overall fitness, flexibility, and endurance.  Increasing your bone density.  Helping with weight control.  Decreasing your body fat.  Increasing your muscle strength.  Reducing stress and tension.  Improving your overall health. People with diabetes who exercise gain additional benefits because exercise:  Reduces appetite.  Improves the body's use of blood sugar (glucose).  Helps lower or control blood glucose.  Decreases blood pressure.  Helps control blood lipids (such as cholesterol and triglycerides).  Improves the body's use of the hormone insulin by:  Increasing the body's insulin sensitivity.  Reducing the body's insulin needs.  Decreases the risk for heart disease because exercising:  Lowers cholesterol and triglycerides levels.  Increases the levels of good cholesterol (such as high-density lipoproteins [HDL]) in the body.  Lowers blood glucose levels. YOUR ACTIVITY PLAN  Choose an activity that you enjoy and set realistic goals. Your health care provider or diabetes educator can help you make an activity plan that works for you. Exercise regularly as directed by your health care provider. This includes:  Performing resistance training twice a week such as push-ups, sit-ups, lifting weights, or using resistance bands.  Performing 150 minutes of cardio exercises each week such as walking, running, or playing sports.  Staying active and spending no more than 90 minutes at one time being inactive. Even short bursts of exercise are good for you. Three 10-minute sessions spread throughout the day are just as beneficial as a single 30-minute session. Some exercise ideas include:  Taking the dog for a walk.  Taking the stairs instead of the elevator.  Dancing to your favorite song.  Doing an exercise  video.  Doing your favorite exercise with a friend. RECOMMENDATIONS FOR EXERCISING WITH TYPE 1 OR TYPE 2 DIABETES   Check your blood glucose before exercising. If blood glucose levels are greater than 240 mg/dL, check for urine ketones. Do not exercise if ketones are present.  Avoid injecting insulin into areas of the body that are going to be exercised. For example, avoid injecting insulin into:  The arms when playing tennis.  The legs when jogging.  Keep a record of:  Food intake before and after you exercise.  Expected peak times of insulin action.  Blood glucose levels before and after you exercise.  The type and amount of exercise you have done.  Review your records with your health care provider. Your health care provider will help you to develop guidelines for adjusting food intake and insulin amounts before and after exercising.  If you take insulin or oral hypoglycemic agents, watch for signs and symptoms of hypoglycemia. They include:  Dizziness.  Shaking.  Sweating.  Chills.  Confusion.  Drink plenty of water while you exercise to prevent dehydration or heat stroke. Body water is lost during exercise and must be replaced.  Talk to your health care provider before starting an exercise program to make sure it is safe for you. Remember, almost any type of activity is better than none. Document Released: 12/24/2003 Document Revised: 02/17/2014 Document Reviewed: 03/12/2013 ExitCare Patient Information 2015 [employer]. This information is not intended to replace advice given to you by your health care provider. Make sure you discuss any questions you have with your health care provider.  

## 2015-03-11 NOTE — Assessment & Plan Note (Signed)
Recheck levels 

## 2015-03-11 NOTE — Assessment & Plan Note (Addendum)
HgbA1C is 6.9--doing well --continue current regimen

## 2015-03-11 NOTE — Progress Notes (Signed)
    Subjective:    Patient ID: Christy Cunningham is a 44 y.o. female presenting with Follow-up and Medication Refill  on 03/11/2015  HPI: Pt is here for DM follow up.  Her HgbA1C is improved back on her diet. Wants to continue to work on weight loss. Needs vitamin D check.  Review of Systems  Constitutional: Negative for fever and chills.  Respiratory: Negative for shortness of breath.   Cardiovascular: Negative for chest pain.  Gastrointestinal: Negative for nausea, vomiting and abdominal pain.  Genitourinary: Negative for dysuria.  Skin: Negative for rash.      Objective:    BP 121/78 mmHg  Pulse 76  Temp(Src) 98 F (36.7 C) (Oral)  Ht 5\' 7"  (1.702 m)  Wt 193 lb 8 oz (87.771 kg)  BMI 30.30 kg/m2 Physical Exam  Constitutional: She is oriented to person, place, and time. She appears well-developed and well-nourished. No distress.  HENT:  Head: Normocephalic and atraumatic.  Eyes: No scleral icterus.  Neck: Neck supple.  Cardiovascular: Normal rate and regular rhythm.   No murmur heard. Pulmonary/Chest: Effort normal and breath sounds normal.  Abdominal: Soft. She exhibits no mass. There is no tenderness.  Neurological: She is alert and oriented to person, place, and time.  Skin: Skin is warm and dry.  Psychiatric: She has a normal mood and affect.        Assessment & Plan:   Problem List Items Addressed This Visit      Unprioritized   Diabetes mellitus - Primary    HgbA1C is 6.9--doing well --continue current regimen      Relevant Medications   sitaGLIPtin-metformin (JANUMET) 50-1000 MG per tablet   Other Relevant Orders   POCT glycosylated hemoglobin (Hb A1C) (Completed)   Vitamin D deficiency    Recheck levels      Relevant Orders   Vit D  25 hydroxy (rtn osteoporosis monitoring)    Other Visit Diagnoses    Overweight (BMI 25.0-29.9)        Trial of phentermine to help with appetite suppression and kick start weight loss    Relevant Medications    phentermine 37.5 MG capsule        Return in about 6 months (around 09/11/2015).  Adriann Thau S 03/11/2015 8:54 AM

## 2015-03-12 ENCOUNTER — Telehealth: Payer: Self-pay | Admitting: *Deleted

## 2015-03-12 LAB — VITAMIN D 25 HYDROXY (VIT D DEFICIENCY, FRACTURES): Vit D, 25-Hydroxy: 29 ng/mL — ABNORMAL LOW (ref 30–100)

## 2015-03-12 NOTE — Telephone Encounter (Signed)
-----   Message from Reva Boresanya S Pratt, MD sent at 03/12/2015  8:14 AM EDT ----- Vitamin d is low but not critical--continue replacement--may use over the counter 600 units/day should be enough

## 2015-03-12 NOTE — Telephone Encounter (Signed)
LM for patient ok per DPR. Christy Cunningham,CMA  

## 2015-07-15 ENCOUNTER — Other Ambulatory Visit: Payer: Self-pay | Admitting: Family Medicine

## 2015-07-15 DIAGNOSIS — E119 Type 2 diabetes mellitus without complications: Secondary | ICD-10-CM

## 2015-07-15 NOTE — Telephone Encounter (Signed)
Refill request for JANUMET.

## 2015-07-16 MED ORDER — SITAGLIPTIN PHOS-METFORMIN HCL 50-1000 MG PO TABS: 1.0000 | ORAL_TABLET | Freq: Two times a day (BID) | ORAL | Status: DC

## 2015-08-14 ENCOUNTER — Telehealth: Payer: Self-pay | Admitting: *Deleted

## 2015-08-14 NOTE — Telephone Encounter (Signed)
[facility] pharmacy called requesting to change Phentermine 37.5 mg capsule to tablets.  Verbal order given to change to tablets, the tablets are cheaper.  Clovis PuMartin, Andilyn Bettcher L, RN

## 2015-08-28 ENCOUNTER — Encounter: Payer: Self-pay | Admitting: *Deleted

## 2015-08-28 ENCOUNTER — Other Ambulatory Visit: Payer: BLUE CROSS/BLUE SHIELD

## 2015-08-28 DIAGNOSIS — R300 Dysuria: Secondary | ICD-10-CM

## 2015-08-28 DIAGNOSIS — N390 Urinary tract infection, site not specified: Secondary | ICD-10-CM

## 2015-08-28 LAB — POCT URINALYSIS DIP (DEVICE)
Bilirubin Urine: NEGATIVE
Glucose, UA: 100 mg/dL — AB
Ketones, ur: NEGATIVE mg/dL
Nitrite: POSITIVE — AB
Protein, ur: NEGATIVE mg/dL
Specific Gravity, Urine: 1.015 (ref 1.005–1.030)
Urobilinogen, UA: 1 mg/dL (ref 0.0–1.0)
pH: 6.5 (ref 5.0–8.0)

## 2015-08-28 MED ORDER — SULFAMETHOXAZOLE-TRIMETHOPRIM 800-160 MG PO TABS: 1.0000 | ORAL_TABLET | Freq: Two times a day (BID) | ORAL | Status: DC

## 2015-08-28 MED ORDER — PHENAZOPYRIDINE HCL 200 MG PO TABS: 200.0000 mg | ORAL_TABLET | Freq: Three times a day (TID) | ORAL | Status: DC | PRN

## 2015-08-28 NOTE — Progress Notes (Signed)
Patient in with complaints of UTI.  Urine culture sent.  Antibiotics ordered and sent to pharmacy.

## 2015-09-01 LAB — URINE CULTURE

## 2015-09-02 ENCOUNTER — Ambulatory Visit (INDEPENDENT_AMBULATORY_CARE_PROVIDER_SITE_OTHER): Payer: BLUE CROSS/BLUE SHIELD | Admitting: Family Medicine

## 2015-09-02 ENCOUNTER — Encounter: Payer: Self-pay | Admitting: Family Medicine

## 2015-09-02 VITALS — BP 124/64 | HR 60 | Ht 67.0 in | Wt 198.0 lb

## 2015-09-02 DIAGNOSIS — Z1239 Encounter for other screening for malignant neoplasm of breast: Secondary | ICD-10-CM | POA: Diagnosis not present

## 2015-09-02 DIAGNOSIS — E1169 Type 2 diabetes mellitus with other specified complication: Secondary | ICD-10-CM | POA: Diagnosis not present

## 2015-09-02 DIAGNOSIS — E119 Type 2 diabetes mellitus without complications: Secondary | ICD-10-CM | POA: Diagnosis not present

## 2015-09-02 DIAGNOSIS — T732XXA Exhaustion due to exposure, initial encounter: Secondary | ICD-10-CM | POA: Diagnosis not present

## 2015-09-02 DIAGNOSIS — K219 Gastro-esophageal reflux disease without esophagitis: Secondary | ICD-10-CM

## 2015-09-02 DIAGNOSIS — E669 Obesity, unspecified: Secondary | ICD-10-CM

## 2015-09-02 DIAGNOSIS — E559 Vitamin D deficiency, unspecified: Secondary | ICD-10-CM

## 2015-09-02 LAB — LIPID PANEL
Cholesterol: 128 mg/dL (ref 125–200)
HDL: 51 mg/dL (ref >=46)
LDL Cholesterol: 64 mg/dL (ref <130)
Total CHOL/HDL Ratio: 2.5 Ratio (ref <=5.0)
Triglycerides: 64 mg/dL (ref <150)
VLDL: 13 mg/dL (ref <30)

## 2015-09-02 LAB — TSH: TSH: 0.627 u[IU]/mL (ref 0.350–4.500)

## 2015-09-02 LAB — POCT GLYCOSYLATED HEMOGLOBIN (HGB A1C): Hemoglobin A1C: 7.9

## 2015-09-02 MED ORDER — OMEPRAZOLE MAGNESIUM 20 MG PO TBEC: 20.0000 mg | DELAYED_RELEASE_TABLET | Freq: Every day | ORAL | Status: DC

## 2015-09-02 MED ORDER — SITAGLIPTIN PHOS-METFORMIN HCL 50-1000 MG PO TABS: 1.0000 | ORAL_TABLET | Freq: Two times a day (BID) | ORAL | Status: DC

## 2015-09-02 NOTE — Patient Instructions (Signed)
Diabetes and Exercise Exercising regularly is important. It is not just about losing weight. It has many health benefits, such as:  Improving your overall fitness, flexibility, and endurance.  Increasing your bone density.  Helping with weight control.  Decreasing your body fat.  Increasing your muscle strength.  Reducing stress and tension.  Improving your overall health. People with diabetes who exercise gain additional benefits because exercise:  Reduces appetite.  Improves the body's use of blood sugar (glucose).  Helps lower or control blood glucose.  Decreases blood pressure.  Helps control blood lipids (such as cholesterol and triglycerides).  Improves the body's use of the hormone insulin by:  Increasing the body's insulin sensitivity.  Reducing the body's insulin needs.  Decreases the risk for heart disease because exercising:  Lowers cholesterol and triglycerides levels.  Increases the levels of good cholesterol (such as high-density lipoproteins [HDL]) in the body.  Lowers blood glucose levels. YOUR ACTIVITY PLAN  Choose an activity that you enjoy, and set realistic goals. To exercise safely, you should begin practicing any new physical activity slowly, and gradually increase the intensity of the exercise over time. Your health care provider or diabetes educator can help create an activity plan that works for you. General recommendations include:  Encouraging children to engage in at least 60 minutes of physical activity each day.  Stretching and performing strength training exercises, such as yoga or weight lifting, at least 2 times per week.  Performing a total of at least 150 minutes of moderate-intensity exercise each week, such as brisk walking or water aerobics.  Exercising at least 3 days per week, making sure you allow no more than 2 consecutive days to pass without exercising.  Avoiding long periods of inactivity (90 minutes or more). When you  have to spend an extended period of time sitting down, take frequent breaks to walk or stretch. RECOMMENDATIONS FOR EXERCISING WITH TYPE 1 OR TYPE 2 DIABETES   Check your blood glucose before exercising. If blood glucose levels are greater than 240 mg/dL, check for urine ketones. Do not exercise if ketones are present.  Avoid injecting insulin into areas of the body that are going to be exercised. For example, avoid injecting insulin into:  The arms when playing tennis.  The legs when jogging.  Keep a record of:  Food intake before and after you exercise.  Expected peak times of insulin action.  Blood glucose levels before and after you exercise.  The type and amount of exercise you have done.  Review your records with your health care provider. Your health care provider will help you to develop guidelines for adjusting food intake and insulin amounts before and after exercising.  If you take insulin or oral hypoglycemic agents, watch for signs and symptoms of hypoglycemia. They include:  Dizziness.  Shaking.  Sweating.  Chills.  Confusion.  Drink plenty of water while you exercise to prevent dehydration or heat stroke. Body water is lost during exercise and must be replaced.  Talk to your health care provider before starting an exercise program to make sure it is safe for you. Remember, almost any type of activity is better than none.   This information is not intended to replace advice given to you by your health care provider. Make sure you discuss any questions you have with your health care provider.   Document Released: 12/24/2003 Document Revised: 02/17/2015 Document Reviewed: 03/12/2013 Elsevier Interactive Patient Education 2016 Elsevier Inc.  

## 2015-09-02 NOTE — Assessment & Plan Note (Signed)
Needs tighter control and work on diet and exercise.  Continue Janumet.

## 2015-09-02 NOTE — Assessment & Plan Note (Signed)
Med refill, stable

## 2015-09-02 NOTE — Assessment & Plan Note (Signed)
On phentermine

## 2015-09-02 NOTE — Progress Notes (Addendum)
    Subjective:    Patient ID: Christy Cunningham is a 44 y.o. female presenting with Diabetes  on 09/02/2015  HPI: Having trouble with diet and exercise. She has started to cut back on sweets and alcohol but is eating too much. [place] [facility]. Reports a lot of fatigue. Cannot remember to take her meds  Review of Systems  Constitutional: Negative for fever and chills.  Respiratory: Negative for shortness of breath.   Cardiovascular: Negative for chest pain.  Gastrointestinal: Negative for nausea, vomiting and abdominal pain.  Genitourinary: Negative for dysuria.  Skin: Negative for rash.      Objective:    BP 124/64 mmHg  Pulse 60  Ht 5\' 7"  (1.702 m)  Wt 198 lb (89.812 kg)  BMI 31.00 kg/m2  SpO2 99% Physical Exam  Constitutional: She is oriented to person, place, and time. She appears well-developed and well-nourished. No distress.  HENT:  Head: Normocephalic and atraumatic.  Eyes: No scleral icterus.  Neck: Neck supple.  Cardiovascular: Normal rate.   Pulmonary/Chest: Effort normal.  Abdominal: Soft.  Neurological: She is alert and oriented to person, place, and time.  Skin: Skin is warm and dry.  Psychiatric: She has a normal mood and affect.   HgbA1C 7.9 today     Assessment & Plan:   Problem List Items Addressed This Visit      Unprioritized   Diabetes mellitus (HCC) - Primary    Needs tighter control and work on diet and exercise.  Continue Janumet.      Relevant Medications   sitaGLIPtin-metformin (JANUMET) 50-1000 MG tablet   Other Relevant Orders   POCT A1C (Completed)   Ambulatory referral to Ophthalmology   Lipid panel   Microalbumin / creatinine urine ratio   GERD (gastroesophageal reflux disease)    Med refill, stable      Relevant Medications   omeprazole (PRILOSEC OTC) 20 MG tablet   Vitamin D deficiency   Obesity    On phentermine      Relevant Medications   sitaGLIPtin-metformin (JANUMET) 50-1000 MG  tablet    Other Visit Diagnoses    Fatigue due to exposure, initial encounter        Relevant Orders    TSH    VITAMIN D 25 Hydroxy (Vit-D Deficiency, Fractures)    Screening for breast cancer        Relevant Orders    MM DIGITAL SCREENING BILATERAL        Return in about 6 months (around 03/01/2016).  Khoury Siemon S 09/02/2015 1:50 PM

## 2015-09-03 LAB — MICROALBUMIN / CREATININE URINE RATIO
Creatinine, Urine: 29 mg/dL (ref 20–320)
Microalb, Ur: <0.2 mg/dL

## 2015-09-03 LAB — VITAMIN D 25 HYDROXY (VIT D DEFICIENCY, FRACTURES): Vit D, 25-Hydroxy: 32 ng/mL (ref 30–100)

## 2015-09-04 ENCOUNTER — Encounter: Payer: Self-pay | Admitting: Family Medicine

## 2015-09-29 ENCOUNTER — Ambulatory Visit
Admission: RE | Admit: 2015-09-29 | Discharge: 2015-09-29 | Disposition: A | Discharge status: Home / Self Care | Payer: BLUE CROSS/BLUE SHIELD | Source: Ambulatory Visit | Attending: Family Medicine | Admitting: Family Medicine

## 2015-09-29 DIAGNOSIS — Z1239 Encounter for other screening for malignant neoplasm of breast: Secondary | ICD-10-CM

## 2015-09-29 LAB — HM DIABETES EYE EXAM

## 2015-12-07 ENCOUNTER — Ambulatory Visit (INDEPENDENT_AMBULATORY_CARE_PROVIDER_SITE_OTHER): Payer: BLUE CROSS/BLUE SHIELD | Admitting: Urgent Care

## 2015-12-07 VITALS — BP 112/70 | HR 98 | Temp 98.2°F | Resp 18 | Ht 66.0 in | Wt 205.2 lb

## 2015-12-07 DIAGNOSIS — R05 Cough: Secondary | ICD-10-CM

## 2015-12-07 DIAGNOSIS — R5381 Other malaise: Secondary | ICD-10-CM

## 2015-12-07 DIAGNOSIS — R51 Headache: Secondary | ICD-10-CM | POA: Diagnosis not present

## 2015-12-07 DIAGNOSIS — J029 Acute pharyngitis, unspecified: Secondary | ICD-10-CM

## 2015-12-07 DIAGNOSIS — R519 Headache, unspecified: Secondary | ICD-10-CM

## 2015-12-07 DIAGNOSIS — R0981 Nasal congestion: Secondary | ICD-10-CM

## 2015-12-07 DIAGNOSIS — R059 Cough, unspecified: Secondary | ICD-10-CM

## 2015-12-07 LAB — POCT RAPID STREP A (OFFICE): Rapid Strep A Screen: NEGATIVE

## 2015-12-07 LAB — POCT INFLUENZA A/B
Influenza A, POC: NEGATIVE
Influenza B, POC: NEGATIVE

## 2015-12-07 MED ORDER — PSEUDOEPHEDRINE HCL ER 120 MG PO TB12: 120.0000 mg | ORAL_TABLET | Freq: Two times a day (BID) | ORAL | Status: DC

## 2015-12-07 MED ORDER — BENZONATATE 100 MG PO CAPS: 100.0000 mg | ORAL_CAPSULE | Freq: Three times a day (TID) | ORAL | Status: DC | PRN

## 2015-12-07 MED ORDER — CETIRIZINE HCL 10 MG PO TABS: 10.0000 mg | ORAL_TABLET | Freq: Every day | ORAL | Status: AC

## 2015-12-07 MED ORDER — HYDROCODONE-HOMATROPINE 5-1.5 MG/5ML PO SYRP: 5.0000 mL | ORAL_SOLUTION | Freq: Every evening | ORAL | Status: DC | PRN

## 2015-12-07 NOTE — Progress Notes (Signed)
    MRN: [id number] DOB: 13-Oct-1971  Subjective:   Christy Cunningham is a 45 y.o. female presenting for chief complaint of Sore Throat and Headache  Reports ~3 day history of worsening sore throat, feels a burning sensation, difficulty swallowing, productive cough which elicits some shob, headache, sinus congestion, sinus pain. Has tried NyQuil with minimal relief. 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 She has had her flu shot this season. Denies chest pain, belly pain, fevers. Denies smoking cigarettes or alcohol use.  Christy Cunningham has a current medication list which includes the following prescription(s): omeprazole, phentermine, and sitagliptin-metformin. Also has No Known Allergies.  Christy Cunningham  has a past medical history of Diabetes mellitus. Also  has past surgical history that includes Abdominal hysterectomy.  Objective:   Vitals: BP 112/70 mmHg  Pulse 98  Temp(Src) 98.2 F (36.8 C) (Oral)  Resp 18  Ht  (1.676 m)  Wt 205 lb 3.2 oz (93.078 kg)  BMI 33.14 kg/m2  SpO2 98%  Physical Exam  Constitutional: She is oriented to person, place, and time. She appears well-developed and well-nourished.  HENT:  TM's intact bilaterally, no effusions or erythema. Nasal turbinates slightly erythematous and swollen with thick clear mucus. Mild frontal and bilateral maxillary sinus tenderness. Postnasal drip present, without oropharyngeal exudates, erythema or abscesses.  Eyes: Right eye exhibits no discharge. Left eye exhibits no discharge. No scleral icterus.  Neck: Normal range of motion. Neck supple.  Cardiovascular: Normal rate, regular rhythm and intact distal pulses.  Exam reveals no gallop and no friction rub.   No murmur heard. Pulmonary/Chest: No respiratory distress. She has no wheezes. She has no rales.  Musculoskeletal: She exhibits no edema.  Lymphadenopathy:    She has no cervical adenopathy.  Neurological: She is alert and oriented to person, place,  and time.  Skin: Skin is warm and dry.   Results for orders placed or performed in visit on 12/07/15 (from the past 24 hour(s))  POCT rapid strep A     Status: None   Collection Time: 12/07/15  8:44 AM  Result Value Ref Range   Rapid Strep A Screen Negative Negative  POCT Influenza A/B     Status: None   Collection Time: 12/07/15  8:52 AM  Result Value Ref Range   Influenza A, POC Negative Negative   Influenza B, POC Negative Negative   Assessment and Plan :   1. Sore throat 2. Headache, unspecified headache type 3. Malaise 4. Cough 5. Nasal congestion - Likely undergoing viral URI, discussed supportive care with patient. If symptoms don't improve in 1 week, consider antibiotic course to cover for infectious process/sinusitis.  Wallis Bamberg, PA-C [facility] [phone] 12/07/2015 8:26 AM

## 2015-12-07 NOTE — Patient Instructions (Signed)
Upper Respiratory Infection, Adult Most upper respiratory infections (URIs) are a viral infection of the air passages leading to the lungs. A URI affects the nose, throat, and upper air passages. The most common type of URI is nasopharyngitis and is typically referred to as "the common cold." URIs run their course and usually go away on their own. Most of the time, a URI does not require medical attention, but sometimes a bacterial infection in the upper airways can follow a viral infection. This is called a secondary infection. Sinus and middle ear infections are common types of secondary upper respiratory infections. Bacterial pneumonia can also complicate a URI. A URI can worsen asthma and chronic obstructive pulmonary disease (COPD). Sometimes, these complications can require emergency medical care and may be life threatening.  CAUSES Almost all URIs are caused by viruses. A virus is a type of germ and can spread from one person to another.  RISKS FACTORS You may be at risk for a URI if:   You smoke.   You have chronic heart or lung disease.  You have a weakened defense (immune) system.   You are very young or very old.   You have nasal allergies or asthma.  You work in crowded or poorly ventilated areas.  You work in health care facilities or schools. SIGNS AND SYMPTOMS  Symptoms typically develop 2-3 days after you come in contact with a cold virus. Most viral URIs last 7-10 days. However, viral URIs from the influenza virus (flu virus) can last 14-18 days and are typically more severe. Symptoms may include:   Runny or stuffy (congested) nose.   Sneezing.   Cough.   Sore throat.   Headache.   Fatigue.   Fever.   Loss of appetite.   Pain in your forehead, behind your eyes, and over your cheekbones (sinus pain).  Muscle aches.  DIAGNOSIS  Your health care provider may diagnose a URI by:  Physical exam.  Tests to check that your symptoms are not due to  another condition such as:  Strep throat.  Sinusitis.  Pneumonia.  Asthma. TREATMENT  A URI goes away on its own with time. It cannot be cured with medicines, but medicines may be prescribed or recommended to relieve symptoms. Medicines may help:  Reduce your fever.  Reduce your cough.  Relieve nasal congestion. HOME CARE INSTRUCTIONS   Take medicines only as directed by your health care provider.   Gargle warm saltwater or take cough drops to comfort your throat as directed by your health care provider.  Use a warm mist humidifier or inhale steam from a shower to increase air moisture. This may make it easier to breathe.  Drink enough fluid to keep your urine clear or pale yellow.   Eat soups and other clear broths and maintain good nutrition.   Rest as needed.   Return to work when your temperature has returned to normal or as your health care provider advises. You may need to stay home longer to avoid infecting others. You can also use a face mask and careful hand washing to prevent spread of the virus.  Increase the usage of your inhaler if you have asthma.   Do not use any tobacco products, including cigarettes, chewing tobacco, or electronic cigarettes. If you need help quitting, ask your health care provider. PREVENTION  The best way to protect yourself from getting a cold is to practice good hygiene.   Avoid oral or hand contact with people with cold   symptoms.   Wash your hands often if contact occurs.  There is no clear evidence that vitamin C, vitamin E, echinacea, or exercise reduces the chance of developing a cold. However, it is always recommended to get plenty of rest, exercise, and practice good nutrition.  SEEK MEDICAL CARE IF:   You are getting worse rather than better.   Your symptoms are not controlled by medicine.   You have chills.  You have worsening shortness of breath.  You have brown or red mucus.  You have yellow or brown nasal  discharge.  You have pain in your face, especially when you bend forward.  You have a fever.  You have swollen neck glands.  You have pain while swallowing.  You have white areas in the back of your throat. SEEK IMMEDIATE MEDICAL CARE IF:   You have severe or persistent:  Headache.  Ear pain.  Sinus pain.  Chest pain.  You have chronic lung disease and any of the following:  Wheezing.  Prolonged cough.  Coughing up blood.  A change in your usual mucus.  You have a stiff neck.  You have changes in your:  Vision.  Hearing.  Thinking.  Mood. MAKE SURE YOU:   Understand these instructions.  Will watch your condition.  Will get help right away if you are not doing well or get worse.   This information is not intended to replace advice given to you by your health care provider. Make sure you discuss any questions you have with your health care provider.   Document Released: 03/29/2001 Document Revised: 02/17/2015 Document Reviewed: 01/08/2014 Elsevier Interactive Patient Education 2016 Elsevier Inc.  

## 2015-12-08 LAB — CULTURE, GROUP A STREP: Organism ID, Bacteria: NORMAL

## 2015-12-09 ENCOUNTER — Encounter: Payer: Self-pay | Admitting: Urgent Care

## 2016-01-08 ENCOUNTER — Other Ambulatory Visit: Payer: Self-pay | Admitting: *Deleted

## 2016-01-08 DIAGNOSIS — E663 Overweight: Secondary | ICD-10-CM

## 2016-01-08 MED ORDER — PHENTERMINE HCL 37.5 MG PO CAPS: 37.5000 mg | ORAL_CAPSULE | ORAL | Status: DC

## 2016-01-08 NOTE — Telephone Encounter (Signed)
Medication called into pharmacy and patient is aware. Azriel Dancy,CMA  

## 2016-01-20 ENCOUNTER — Other Ambulatory Visit: Payer: Self-pay | Admitting: Family Medicine

## 2016-02-08 ENCOUNTER — Encounter: Payer: Self-pay | Admitting: Family Medicine

## 2016-02-28 ENCOUNTER — Other Ambulatory Visit: Payer: Self-pay | Admitting: Family Medicine

## 2016-05-20 ENCOUNTER — Ambulatory Visit (INDEPENDENT_AMBULATORY_CARE_PROVIDER_SITE_OTHER): Payer: BLUE CROSS/BLUE SHIELD | Admitting: Family Medicine

## 2016-05-20 ENCOUNTER — Encounter: Payer: Self-pay | Admitting: Family Medicine

## 2016-05-20 DIAGNOSIS — J0100 Acute maxillary sinusitis, unspecified: Secondary | ICD-10-CM

## 2016-05-20 DIAGNOSIS — J329 Chronic sinusitis, unspecified: Secondary | ICD-10-CM | POA: Insufficient documentation

## 2016-05-20 MED ORDER — FLUTICASONE PROPIONATE 50 MCG/ACT NA SUSP: 2.0000 | Freq: Every day | NASAL | 1 refills | Status: AC

## 2016-05-20 NOTE — Assessment & Plan Note (Addendum)
The patient is presenting with acute sinusitis. It has been going on for approximately 1 week at this time. No red flags on history or exam. Less likely bacterial in nature at this time.  -We'll attempt Flonase twice a day until her symptoms resolve.  -She can continue to take her other medications as needed -I discussed reasons to return to clinic such as worsening of her symptoms, prolonged symptoms that do not resolve over the next week or 2, change in vision, chest pain, SOB, otalgias. Pt voiced understanding.

## 2016-05-20 NOTE — Patient Instructions (Signed)

## 2016-05-20 NOTE — Progress Notes (Signed)
Subjective: CC: sinus issue HPI:  Patient is a 45 y.o. female with a past medical history of DM presenting to clinic today for a same day appt for sinus issues.  Thursday started having sore throat. She had chest congestion, rhinorrhea, and nasal congestion a few days later. 4 days ago she started having a productive cough of yellow sputum.  She's had chills but no fevers.  She endorses L otalgias for the last few days.  No headaches, vision change, no confusion, no drainage from the ear, no sick contacts, no SOB, no chest pain.  She's tried [facility]us and Zyrtec without improvement.   Social History: never smoker   ROS: All other systems reviewed and are negative.  Past Medical History Patient Active Problem List   Diagnosis Date Noted  . Sinusitis 05/20/2016  . Obesity 09/02/2015  . Vitamin D deficiency 10/22/2014  . Migraines 04/01/2014  . Diabetes mellitus (HCC) 05/01/2012  . GERD (gastroesophageal reflux disease) 05/01/2012    Medications- reviewed and updated Current Outpatient Prescriptions  Medication Sig Dispense Refill  . benzonatate (TESSALON) 100 MG capsule Take 1-2 capsules (100-200 mg total) by mouth 3 (three) times daily as needed for cough. 40 capsule 0  . cetirizine (ZYRTEC) 10 MG tablet Take 1 tablet (10 mg total) by mouth daily. 30 tablet 11  . fluconazole (DIFLUCAN) 150 MG tablet TAKE ONE TABLET BY MOUTH ONCE DAILY. REPEAT  IN  24  HOURS  IF  NEEDED 2 tablet 0  . fluconazole (DIFLUCAN) 150 MG tablet TAKE ONE TABLET BY MOUTH ONCE DAILY. REPEAT  IN  24  HOURS  IF  NEEDED 2 tablet 0  . fluticasone (FLONASE) 50 MCG/ACT nasal spray Place 2 sprays into both nostrils daily. 16 g 1  . HYDROcodone-homatropine (HYCODAN) 5-1.5 MG/5ML syrup Take 5 mLs by mouth at bedtime as needed. 120 mL 0  . omeprazole (PRILOSEC OTC) 20 MG tablet Take 1 tablet (20 mg total) by mouth daily. 30 tablet 5  . phentermine 37.5 MG capsule Take 1 capsule (37.5 mg total) by mouth  every morning. 30 capsule 2  . pseudoephedrine (SUDAFED 12 HOUR) 120 MG 12 hr tablet Take 1 tablet (120 mg total) by mouth 2 (two) times daily. 30 tablet 3  . sitaGLIPtin-metformin (JANUMET) 50-1000 MG tablet Take 1 tablet by mouth 2 (two) times daily with a meal. 60 tablet 5   No current facility-administered medications for this visit.     Objective: Office vital signs reviewed. BP 133/90   Pulse 89   Temp 98.2 F (36.8 C) (Oral)   Ht  (1.676 m)   Wt 231 lb 3.2 oz (104.9 kg)   BMI 37.32 kg/m    Physical Examination:  General: Awake, alert, well- nourished, NAD ENMT:  TMs intact, normal light reflex, no erythema, no bulging. Nasal turbinates moist. MMM, Oropharynx clear without erythema or tonsillar exudate/hypertrophy No evidence of dental infection. Shotty anterior cervical LAD. Small left submandibular lymph node <1cm. Tenderness to palpation over the maxillary sinuses.  Eyes: Conjunctiva non-injected. PERRL.  Cardio: RRR, no m/r/g noted.  Pulm: No increased WOB.  CTAB, without wheezes, rhonchi or crackles noted.    Assessment/Plan: Sinusitis The patient is presenting with acute sinusitis. It has been going on for approximately 1 week at this time. No red flags on history or exam. Less likely bacterial in nature at this time.  -We'll attempt Flonase twice a day until her symptoms resolve.  -She can continue to take her  other medications as needed -I discussed reasons to return to clinic such as worsening of her symptoms, prolonged symptoms that do not resolve over the next week or 2, change in vision, chest pain, SOB, otalgias. Pt voiced understanding.    No orders of the defined types were placed in this encounter.   Meds ordered this encounter  Medications  . fluticasone (FLONASE) 50 MCG/ACT nasal spray    Sig: Place 2 sprays into both nostrils daily.    Dispense:  16 g    Refill:  1    Joanna Puff PGY-3, [facility]

## 2016-06-09 DIAGNOSIS — E669 Obesity, unspecified: Secondary | ICD-10-CM | POA: Diagnosis not present

## 2016-06-09 DIAGNOSIS — Z01419 Encounter for gynecological examination (general) (routine) without abnormal findings: Secondary | ICD-10-CM | POA: Diagnosis not present

## 2016-06-09 DIAGNOSIS — Z131 Encounter for screening for diabetes mellitus: Secondary | ICD-10-CM | POA: Diagnosis not present

## 2016-06-09 DIAGNOSIS — Z13220 Encounter for screening for lipoid disorders: Secondary | ICD-10-CM | POA: Diagnosis not present

## 2016-06-09 DIAGNOSIS — Z01411 Encounter for gynecological examination (general) (routine) with abnormal findings: Secondary | ICD-10-CM | POA: Diagnosis not present

## 2016-06-09 DIAGNOSIS — Z6837 Body mass index (BMI) 37.0-37.9, adult: Secondary | ICD-10-CM | POA: Diagnosis not present

## 2016-06-09 DIAGNOSIS — Z1321 Encounter for screening for nutritional disorder: Secondary | ICD-10-CM | POA: Diagnosis not present

## 2016-06-09 DIAGNOSIS — Z113 Encounter for screening for infections with a predominantly sexual mode of transmission: Secondary | ICD-10-CM | POA: Diagnosis not present

## 2016-06-09 DIAGNOSIS — Z1329 Encounter for screening for other suspected endocrine disorder: Secondary | ICD-10-CM | POA: Diagnosis not present

## 2016-06-09 DIAGNOSIS — Z08 Encounter for follow-up examination after completed treatment for malignant neoplasm: Secondary | ICD-10-CM | POA: Diagnosis not present

## 2016-06-28 ENCOUNTER — Other Ambulatory Visit: Payer: Self-pay | Admitting: *Deleted

## 2016-06-28 DIAGNOSIS — E663 Overweight: Secondary | ICD-10-CM

## 2016-06-28 MED ORDER — PHENTERMINE HCL 37.5 MG PO CAPS: 37.5000 mg | ORAL_CAPSULE | ORAL | 2 refills | Status: DC

## 2016-06-28 NOTE — Telephone Encounter (Signed)
rx called into pharmacy

## 2016-07-04 ENCOUNTER — Other Ambulatory Visit: Payer: Self-pay | Admitting: *Deleted

## 2016-07-04 DIAGNOSIS — E663 Overweight: Secondary | ICD-10-CM

## 2016-07-04 MED ORDER — PHENTERMINE HCL 37.5 MG PO CAPS: 37.5000 mg | ORAL_CAPSULE | ORAL | 2 refills | Status: DC

## 2016-07-04 NOTE — Telephone Encounter (Signed)
[facility]. Last rx written 01/08/16. Last filled 04/11/16.

## 2016-11-17 ENCOUNTER — Other Ambulatory Visit: Payer: Self-pay | Admitting: Family Medicine

## 2016-11-17 DIAGNOSIS — Z79899 Other long term (current) drug therapy: Secondary | ICD-10-CM | POA: Diagnosis not present

## 2016-11-17 DIAGNOSIS — E1165 Type 2 diabetes mellitus with hyperglycemia: Secondary | ICD-10-CM | POA: Diagnosis not present

## 2016-11-17 DIAGNOSIS — Z794 Long term (current) use of insulin: Secondary | ICD-10-CM | POA: Diagnosis not present

## 2016-11-17 DIAGNOSIS — Z5181 Encounter for therapeutic drug level monitoring: Secondary | ICD-10-CM | POA: Diagnosis not present

## 2016-11-17 DIAGNOSIS — E669 Obesity, unspecified: Secondary | ICD-10-CM | POA: Diagnosis not present

## 2016-11-17 DIAGNOSIS — E559 Vitamin D deficiency, unspecified: Secondary | ICD-10-CM | POA: Diagnosis not present

## 2016-11-17 DIAGNOSIS — Z1231 Encounter for screening mammogram for malignant neoplasm of breast: Secondary | ICD-10-CM

## 2016-11-30 ENCOUNTER — Ambulatory Visit
Admission: RE | Admit: 2016-11-30 | Discharge: 2016-11-30 | Disposition: A | Discharge status: Home / Self Care | Payer: BLUE CROSS/BLUE SHIELD | Source: Ambulatory Visit | Attending: Family Medicine | Admitting: Family Medicine

## 2016-11-30 DIAGNOSIS — Z1231 Encounter for screening mammogram for malignant neoplasm of breast: Secondary | ICD-10-CM

## 2016-12-09 DIAGNOSIS — E119 Type 2 diabetes mellitus without complications: Secondary | ICD-10-CM | POA: Diagnosis not present

## 2016-12-09 DIAGNOSIS — H25013 Cortical age-related cataract, bilateral: Secondary | ICD-10-CM | POA: Diagnosis not present

## 2016-12-09 DIAGNOSIS — H2513 Age-related nuclear cataract, bilateral: Secondary | ICD-10-CM | POA: Diagnosis not present

## 2016-12-09 DIAGNOSIS — Z794 Long term (current) use of insulin: Secondary | ICD-10-CM | POA: Diagnosis not present

## 2016-12-09 LAB — HM DIABETES EYE EXAM

## 2017-03-30 DIAGNOSIS — E1165 Type 2 diabetes mellitus with hyperglycemia: Secondary | ICD-10-CM | POA: Diagnosis not present

## 2017-03-30 DIAGNOSIS — Z794 Long term (current) use of insulin: Secondary | ICD-10-CM | POA: Diagnosis not present

## 2017-03-30 DIAGNOSIS — Z5181 Encounter for therapeutic drug level monitoring: Secondary | ICD-10-CM | POA: Diagnosis not present

## 2017-03-30 DIAGNOSIS — E669 Obesity, unspecified: Secondary | ICD-10-CM | POA: Diagnosis not present

## 2017-07-20 ENCOUNTER — Encounter (HOSPITAL_BASED_OUTPATIENT_CLINIC_OR_DEPARTMENT_OTHER): Payer: Self-pay | Admitting: Emergency Medicine

## 2017-07-20 ENCOUNTER — Emergency Department (HOSPITAL_BASED_OUTPATIENT_CLINIC_OR_DEPARTMENT_OTHER)
Admission: EM | Admit: 2017-07-20 | Discharge: 2017-07-20 | Disposition: A | Discharge status: Home / Self Care | Payer: BLUE CROSS/BLUE SHIELD | Attending: Physician Assistant | Admitting: Physician Assistant

## 2017-07-20 ENCOUNTER — Emergency Department (HOSPITAL_BASED_OUTPATIENT_CLINIC_OR_DEPARTMENT_OTHER): Payer: BLUE CROSS/BLUE SHIELD

## 2017-07-20 DIAGNOSIS — E119 Type 2 diabetes mellitus without complications: Secondary | ICD-10-CM | POA: Diagnosis not present

## 2017-07-20 DIAGNOSIS — R030 Elevated blood-pressure reading, without diagnosis of hypertension: Secondary | ICD-10-CM | POA: Diagnosis present

## 2017-07-20 DIAGNOSIS — Z79899 Other long term (current) drug therapy: Secondary | ICD-10-CM | POA: Diagnosis not present

## 2017-07-20 DIAGNOSIS — Z7984 Long term (current) use of oral hypoglycemic drugs: Secondary | ICD-10-CM | POA: Insufficient documentation

## 2017-07-20 DIAGNOSIS — R51 Headache: Secondary | ICD-10-CM | POA: Insufficient documentation

## 2017-07-20 DIAGNOSIS — I10 Essential (primary) hypertension: Secondary | ICD-10-CM | POA: Diagnosis not present

## 2017-07-20 LAB — COMPREHENSIVE METABOLIC PANEL
ALT: 27 U/L (ref 14–54)
AST: 23 U/L (ref 15–41)
Albumin: 3.6 g/dL (ref 3.5–5.0)
Alkaline Phosphatase: 73 U/L (ref 38–126)
Anion gap: 7 (ref 5–15)
BUN: 8 mg/dL (ref 6–20)
CO2: 27 mmol/L (ref 22–32)
Calcium: 8.8 mg/dL — ABNORMAL LOW (ref 8.9–10.3)
Chloride: 103 mmol/L (ref 101–111)
Creatinine, Ser: 0.66 mg/dL (ref 0.44–1.00)
GFR calc Af Amer: >60 mL/min (ref >60)
GFR calc non Af Amer: >60 mL/min (ref >60)
Glucose, Bld: 96 mg/dL (ref 65–99)
Potassium: 3.6 mmol/L (ref 3.5–5.1)
Sodium: 137 mmol/L (ref 135–145)
Total Bilirubin: 0.3 mg/dL (ref 0.3–1.2)
Total Protein: 7.6 g/dL (ref 6.5–8.1)

## 2017-07-20 LAB — CBC WITH DIFFERENTIAL/PLATELET
Basophils Absolute: 0 10*3/uL (ref 0.0–0.1)
Basophils Relative: 0 %
Eosinophils Absolute: 0.2 10*3/uL (ref 0.0–0.7)
Eosinophils Relative: 2 %
HCT: 40.2 % (ref 36.0–46.0)
Hemoglobin: 13.4 g/dL (ref 12.0–15.0)
Lymphocytes Relative: 35 %
Lymphs Abs: 3.5 10*3/uL (ref 0.7–4.0)
MCH: 32.5 pg (ref 26.0–34.0)
MCHC: 33.3 g/dL (ref 30.0–36.0)
MCV: 97.6 fL (ref 78.0–100.0)
Monocytes Absolute: 0.7 10*3/uL (ref 0.1–1.0)
Monocytes Relative: 7 %
Neutro Abs: 5.7 10*3/uL (ref 1.7–7.7)
Neutrophils Relative %: 56 %
Platelets: 351 10*3/uL (ref 150–400)
RBC: 4.12 MIL/uL (ref 3.87–5.11)
RDW: 11.6 % (ref 11.5–15.5)
WBC: 10.1 10*3/uL (ref 4.0–10.5)

## 2017-07-20 LAB — CBG MONITORING, ED: Glucose-Capillary: 95 mg/dL (ref 65–99)

## 2017-07-20 NOTE — ED Provider Notes (Signed)
[facility]   CSN: [id number] Arrival date & time: 07/20/17  1904     History   Chief Complaint Chief Complaint  Patient presents with  . Hypertension    HPI Christy Cunningham is a 46 y.o. female.  HPI   46 year old female presenting with high blood pressure. Patient reports she's diabetic mature her primary son had elevated blood pressures been taking it twice a day every day for the last week. She has mild headache. She's had a lot of increased stress in her life. No chest pain.  Past Medical History:  Diagnosis Date  . Diabetes mellitus     Patient Active Problem List   Diagnosis Date Noted  . Sinusitis 05/20/2016  . Obesity 09/02/2015  . Vitamin D deficiency 10/22/2014  . Migraines 04/01/2014  . Diabetes mellitus (HCC) 05/01/2012  . GERD (gastroesophageal reflux disease) 05/01/2012    Past Surgical History:  Procedure Laterality Date  . ABDOMINAL HYSTERECTOMY      OB History    No data available       Home Medications    Prior to Admission medications   Medication Sig Start Date End Date Taking? Authorizing Provider  metFORMIN (GLUCOPHAGE) 500 MG tablet Take by mouth 2 (two) times daily with a meal.   Yes [provider]  benzonatate (TESSALON) 100 MG capsule Take 1-2 capsules (100-200 mg total) by mouth 3 (three) times daily as needed for cough. 12/07/15   Wallis Bamberg, PA-C  cetirizine (ZYRTEC) 10 MG tablet Take 1 tablet (10 mg total) by mouth daily. 12/07/15   Wallis Bamberg, PA-C  fluconazole (DIFLUCAN) 150 MG tablet TAKE ONE TABLET BY MOUTH ONCE DAILY. REPEAT  IN  24  HOURS  IF  NEEDED 01/21/16   Reva Bores, MD  fluconazole (DIFLUCAN) 150 MG tablet TAKE ONE TABLET BY MOUTH ONCE DAILY. REPEAT  IN  24  HOURS  IF  NEEDED 02/29/16   Reva Bores, MD  fluticasone [facility]) 50 MCG/ACT nasal spray Place 2 sprays into both nostrils daily. 05/20/16   Joanna Puff, MD  HYDROcodone-homatropine [facility]) 5-1.5 MG/5ML syrup Take 5  mLs by mouth at bedtime as needed. 12/07/15   Wallis Bamberg, PA-C  omeprazole (PRILOSEC OTC) 20 MG tablet Take 1 tablet (20 mg total) by mouth daily. 09/02/15   Reva Bores, MD  phentermine 37.5 MG capsule Take 1 capsule (37.5 mg total) by mouth every morning. 07/04/16   Reva Bores, MD  pseudoephedrine (SUDAFED 12 HOUR) 120 MG 12 hr tablet Take 1 tablet (120 mg total) by mouth 2 (two) times daily. 12/07/15   Wallis Bamberg, PA-C  sitaGLIPtin-metformin (JANUMET) 50-1000 MG tablet Take 1 tablet by mouth 2 (two) times daily with a meal. 09/02/15   Reva Bores, MD    Family History Family History  Problem Relation Age of Onset  . Diabetes Sister     Social History Social History  Substance Use Topics  . Smoking status: Never Smoker  . Smokeless tobacco: Never Used  . Alcohol use No     Allergies   Patient has no known allergies.   Review of Systems Review of Systems  Constitutional: Negative for activity change.  Respiratory: Negative for shortness of breath.   Cardiovascular: Negative for chest pain.  Gastrointestinal: Negative for abdominal pain.  Neurological: Positive for headaches.  Psychiatric/Behavioral: The patient is nervous/anxious.      Physical Exam Updated Vital Signs BP (!) 150/90 (BP Location: Left Arm)  Pulse 82   Temp 98.1 F (36.7 C) (Oral)   Resp 16   Ht  (1.676 m)   Wt 115.7 kg (255 lb)   SpO2 100%   BMI 41.16 kg/m   Physical Exam  Constitutional: She is oriented to person, place, and time. She appears well-developed and well-nourished.  HENT:  Head: Normocephalic and atraumatic.  Eyes: Right eye exhibits no discharge.  Cardiovascular: Normal rate and regular rhythm.   Pulmonary/Chest: Effort normal and breath sounds normal. No respiratory distress. She has no wheezes.  Abdominal: Soft. She exhibits no distension. There is no tenderness.  Neurological: She is oriented to person, place, and time.  Skin: Skin is warm and dry. She is not  diaphoretic.  Psychiatric: She has a normal mood and affect.  Nursing note and vitals reviewed.    ED Treatments / Results  Labs (all labs ordered are listed, but only abnormal results are displayed) Labs Reviewed  COMPREHENSIVE METABOLIC PANEL - Abnormal; Notable for the following:       Result Value   Calcium 8.8 (*)    All other components within normal limits  CBC WITH DIFFERENTIAL/PLATELET  CBG MONITORING, ED    EKG  EKG Interpretation None       Radiology Ct Head Wo Contrast  Result Date: 07/20/2017 CLINICAL DATA:  Right-sided headache 4 days.  No injury. EXAM: CT HEAD WITHOUT CONTRAST TECHNIQUE: Contiguous axial images were obtained from the base of the skull through the vertex without intravenous contrast. COMPARISON:  05/29/2005 FINDINGS: Brain: Ventricles, cisterns and other CSF spaces are within normal. There is no mass, mass effect, shift of midline structures or acute hemorrhage. No evidence of acute infarction. Vascular: No hyperdense vessel or unexpected calcification. Skull: Normal. Negative for fracture or focal lesion. 1.5 cm nodule within the soft tissues of the left posterior parietal scalp only slightly bigger compared to 2006 and likely benign. Sinuses/Orbits: No acute finding. Other: None. IMPRESSION: No acute intracranial findings. Electronically Signed   By: Elberta Fortis M.D.   On: 07/20/2017 22:17    Procedures Procedures (including critical care time)  Medications Ordered in ED Medications - No data to display   Initial Impression / Assessment and Plan / ED Course  I have reviewed the triage vital signs and the nursing notes.  Pertinent labs & imaging results that were available during my care of the patient were reviewed by me and considered in my medical decision making (see chart for details).     Patient very pleasant 46 year old female with history of diabetes and new hypertension in the last couple weeks. She's been working overtime had a  lot of stress at work. Primary found to be elevated. They will do lab work and a CAT scan given her mild headaches. Anticipate they'll be negative she'll follow-up with her primary for blood pressure control as an outpatient  Final Clinical Impressions(s) / ED Diagnoses   Final diagnoses:  Essential hypertension    New Prescriptions Discharge Medication List as of 07/20/2017 10:53 PM       Abelino Derrick, MD 07/20/17 2323

## 2017-07-20 NOTE — ED Triage Notes (Signed)
Pt reports she has been checking her BP and getting high readings 160/100 for 1 week. No hx of HTN. Also reports HA.

## 2017-07-20 NOTE — Discharge Instructions (Signed)
Please follow up with your primary care provider for blood pressure treatment as an outpatient.

## 2017-07-20 NOTE — ED Notes (Signed)
ED Provider at bedside. 

## 2017-07-24 ENCOUNTER — Ambulatory Visit: Payer: BC Managed Care – PPO | Admitting: Family Medicine

## 2017-07-25 ENCOUNTER — Encounter: Payer: Self-pay | Admitting: Family Medicine

## 2017-07-25 ENCOUNTER — Ambulatory Visit (INDEPENDENT_AMBULATORY_CARE_PROVIDER_SITE_OTHER): Payer: BLUE CROSS/BLUE SHIELD | Admitting: Family Medicine

## 2017-07-25 VITALS — BP 128/81 | HR 90 | Wt 252.0 lb

## 2017-07-25 DIAGNOSIS — E1169 Type 2 diabetes mellitus with other specified complication: Secondary | ICD-10-CM

## 2017-07-25 DIAGNOSIS — Z794 Long term (current) use of insulin: Secondary | ICD-10-CM | POA: Diagnosis not present

## 2017-07-25 DIAGNOSIS — F331 Major depressive disorder, recurrent, moderate: Secondary | ICD-10-CM | POA: Diagnosis not present

## 2017-07-25 DIAGNOSIS — I10 Essential (primary) hypertension: Secondary | ICD-10-CM | POA: Diagnosis not present

## 2017-07-25 MED ORDER — BUPROPION HCL ER (XL) 150 MG PO TB24: 150.0000 mg | ORAL_TABLET | Freq: Every day | ORAL | 2 refills | Status: DC

## 2017-07-25 MED ORDER — HYDROCHLOROTHIAZIDE 25 MG PO TABS: 25.0000 mg | ORAL_TABLET | Freq: Every day | ORAL | 3 refills | Status: DC

## 2017-07-25 NOTE — Patient Instructions (Signed)
DASH Eating Plan DASH stands for "Dietary Approaches to Stop Hypertension." The DASH eating plan is a healthy eating plan that has been shown to reduce high blood pressure (hypertension). It may also reduce your risk for type 2 diabetes, heart disease, and stroke. The DASH eating plan may also help with weight loss. What are tips for following this plan? General guidelines  Avoid eating more than 2,300 mg (milligrams) of salt (sodium) a day. If you have hypertension, you may need to reduce your sodium intake to 1,500 mg a day.  Limit alcohol intake to no more than 1 drink a day for nonpregnant women and 2 drinks a day for men. One drink equals 12 oz of beer, 5 oz of wine, or 1 oz of hard liquor.  Work with your health care provider to maintain a healthy body weight or to lose weight. Ask what an ideal weight is for you.  Get at least 30 minutes of exercise that causes your heart to beat faster (aerobic exercise) most days of the week. Activities may include walking, swimming, or biking.  Work with your health care provider or diet and nutrition specialist (dietitian) to adjust your eating plan to your individual calorie needs. Reading food labels  Check food labels for the amount of sodium per serving. Choose foods with less than 5 percent of the Daily Value of sodium. Generally, foods with less than 300 mg of sodium per serving fit into this eating plan.  To find whole grains, look for the word "whole" as the first word in the ingredient list. Shopping  Buy products labeled as "low-sodium" or "no salt added."  Buy fresh foods. Avoid canned foods and premade or frozen meals. Cooking  Avoid adding salt when cooking. Use salt-free seasonings or herbs instead of table salt or sea salt. Check with your health care provider or pharmacist before using salt substitutes.  Do not fry foods. Cook foods using healthy methods such as baking, boiling, grilling, and broiling instead.  Cook with  heart-healthy oils, such as olive, canola, soybean, or sunflower oil. Meal planning   Eat a balanced diet that includes: ? 5 or more servings of fruits and vegetables each day. At each meal, try to fill half of your plate with fruits and vegetables. ? Up to 6-8 servings of whole grains each day. ? Less than 6 oz of lean meat, poultry, or fish each day. A 3-oz serving of meat is about the same size as a deck of cards. One egg equals 1 oz. ? 2 servings of low-fat dairy each day. ? A serving of nuts, seeds, or beans 5 times each week. ? Heart-healthy fats. Healthy fats called Omega-3 fatty acids are found in foods such as flaxseeds and coldwater fish, like sardines, salmon, and mackerel.  Limit how much you eat of the following: ? Canned or prepackaged foods. ? Food that is high in trans fat, such as fried foods. ? Food that is high in saturated fat, such as fatty meat. ? Sweets, desserts, sugary drinks, and other foods with added sugar. ? Full-fat dairy products.  Do not salt foods before eating.  Try to eat at least 2 vegetarian meals each week.  Eat more home-cooked food and less restaurant, buffet, and fast food.  When eating at a restaurant, ask that your food be prepared with less salt or no salt, if possible. What foods are recommended? The items listed may not be a complete list. Talk with your dietitian about what   dietary choices are best for you. Grains Whole-grain or whole-wheat bread. Whole-grain or whole-wheat pasta. Brown rice. Oatmeal. Quinoa. Bulgur. Whole-grain and low-sodium cereals. Pita bread. Low-fat, low-sodium crackers. Whole-wheat flour tortillas. Vegetables Fresh or frozen vegetables (raw, steamed, roasted, or grilled). Low-sodium or reduced-sodium tomato and vegetable juice. Low-sodium or reduced-sodium tomato sauce and tomato paste. Low-sodium or reduced-sodium canned vegetables. Fruits All fresh, dried, or frozen fruit. Canned fruit in natural juice (without  added sugar). Meat and other protein foods Skinless chicken or turkey. Ground chicken or turkey. Pork with fat trimmed off. Fish and seafood. Egg whites. Dried beans, peas, or lentils. Unsalted nuts, nut butters, and seeds. Unsalted canned beans. Lean cuts of beef with fat trimmed off. Low-sodium, lean deli meat. Dairy Low-fat (1%) or fat-free (skim) milk. Fat-free, low-fat, or reduced-fat cheeses. Nonfat, low-sodium ricotta or cottage cheese. Low-fat or nonfat yogurt. Low-fat, low-sodium cheese. Fats and oils Soft margarine without trans fats. Vegetable oil. Low-fat, reduced-fat, or light mayonnaise and salad dressings (reduced-sodium). Canola, safflower, olive, soybean, and sunflower oils. Avocado. Seasoning and other foods Herbs. Spices. Seasoning mixes without salt. Unsalted popcorn and pretzels. Fat-free sweets. What foods are not recommended? The items listed may not be a complete list. Talk with your dietitian about what dietary choices are best for you. Grains Baked goods made with fat, such as croissants, muffins, or some breads. Dry pasta or rice meal packs. Vegetables Creamed or fried vegetables. Vegetables in a cheese sauce. Regular canned vegetables (not low-sodium or reduced-sodium). Regular canned tomato sauce and paste (not low-sodium or reduced-sodium). Regular tomato and vegetable juice (not low-sodium or reduced-sodium). Pickles. Olives. Fruits Canned fruit in a light or heavy syrup. Fried fruit. Fruit in cream or butter sauce. Meat and other protein foods Fatty cuts of meat. Ribs. Fried meat. Bacon. Sausage. Bologna and other processed lunch meats. Salami. Fatback. Hotdogs. Bratwurst. Salted nuts and seeds. Canned beans with added salt. Canned or smoked fish. Whole eggs or egg yolks. Chicken or turkey with skin. Dairy Whole or 2% milk, cream, and half-and-half. Whole or full-fat cream cheese. Whole-fat or sweetened yogurt. Full-fat cheese. Nondairy creamers. Whipped toppings.  Processed cheese and cheese spreads. Fats and oils Butter. Stick margarine. Lard. Shortening. Ghee. Bacon fat. Tropical oils, such as coconut, palm kernel, or palm oil. Seasoning and other foods Salted popcorn and pretzels. Onion salt, garlic salt, seasoned salt, table salt, and sea salt. Worcestershire sauce. Tartar sauce. Barbecue sauce. Teriyaki sauce. Soy sauce, including reduced-sodium. Steak sauce. Canned and packaged gravies. Fish sauce. Oyster sauce. Cocktail sauce. Horseradish that you find on the shelf. Ketchup. Mustard. Meat flavorings and tenderizers. Bouillon cubes. Hot sauce and Tabasco sauce. Premade or packaged marinades. Premade or packaged taco seasonings. Relishes. Regular salad dressings. Where to find more information:  National Heart, Lung, and Blood Institute: www.nhlbi.nih.gov  American Heart Association: www.heart.org Summary  The DASH eating plan is a healthy eating plan that has been shown to reduce high blood pressure (hypertension). It may also reduce your risk for type 2 diabetes, heart disease, and stroke.  With the DASH eating plan, you should limit salt (sodium) intake to 2,300 mg a day. If you have hypertension, you may need to reduce your sodium intake to 1,500 mg a day.  When on the DASH eating plan, aim to eat more fresh fruits and vegetables, whole grains, lean proteins, low-fat dairy, and heart-healthy fats.  Work with your health care provider or diet and nutrition specialist (dietitian) to adjust your eating plan to your individual   calorie needs. This information is not intended to replace advice given to you by your health care provider. Make sure you discuss any questions you have with your health care provider. Document Released: 09/22/2011 Document Revised: 09/26/2016 Document Reviewed: 09/26/2016 Elsevier Interactive Patient Education  2017 Elsevier Inc.  

## 2017-07-26 ENCOUNTER — Telehealth: Payer: Self-pay | Admitting: Clinical

## 2017-07-26 DIAGNOSIS — F331 Major depressive disorder, recurrent, moderate: Secondary | ICD-10-CM | POA: Insufficient documentation

## 2017-07-26 DIAGNOSIS — I10 Essential (primary) hypertension: Secondary | ICD-10-CM | POA: Insufficient documentation

## 2017-07-26 NOTE — Assessment & Plan Note (Signed)
Well controlled on Tresiba and Metformin--continue these.

## 2017-07-26 NOTE — Assessment & Plan Note (Signed)
Begin Wellbutrin

## 2017-07-26 NOTE — Progress Notes (Signed)
   Subjective:    Patient ID: Vale Peraza is a 46 y.o. female presenting with Acute Visit  on 07/25/2017  HPI: Here due to abnormal blood pressure recently. Noted it to be up on more than one recent occasion with headache. Additionally she is complaining of having worsening depression, anxiety, inability to find joy, poor sleep. She is not exercising as she was before. She would like to try meds.  Review of Systems  Constitutional: Negative for chills and fever.  Respiratory: Negative for shortness of breath.   Cardiovascular: Negative for chest pain.  Gastrointestinal: Negative for abdominal pain, nausea and vomiting.  Genitourinary: Negative for dysuria.  Skin: Negative for rash.  Neurological: Positive for headaches.  Psychiatric/Behavioral: Positive for dysphoric mood and sleep disturbance. Negative for self-injury and suicidal ideas. The patient is nervous/anxious.       Objective:    BP 128/81   Pulse 90   Wt 252 lb (114.3 kg)   BMI 40.67 kg/m  Physical Exam  Constitutional: She is oriented to person, place, and time. She appears well-developed and well-nourished. No distress.  HENT:  Head: Normocephalic and atraumatic.  Eyes: No scleral icterus.  Neck: Neck supple.  Cardiovascular: Normal rate and regular rhythm.   No murmur heard. Pulmonary/Chest: Effort normal and breath sounds normal.  Abdominal: Soft.  Neurological: She is alert and oriented to person, place, and time.  Skin: Skin is warm and dry.  Psychiatric: She has a normal mood and affect.        Assessment & Plan:   Problem List Items Addressed This Visit      Unprioritized   Diabetes mellitus (HCC)    Well controlled on Tresiba and Metformin--continue these.      Relevant Medications   insulin degludec (TRESIBA FLEXTOUCH) 100 UNIT/ML SOPN FlexTouch Pen   Essential hypertension - Primary    Trial of HCTZ      Relevant Medications   hydrochlorothiazide (HYDRODIURIL) 25 MG tablet   Moderate episode of recurrent major depressive disorder (HCC)    Begin Wellbutrin      Relevant Medications   buPROPion (WELLBUTRIN XL) 150 MG 24 hr tablet      Total face-to-face time with patient: 15 minutes. Over 50% of encounter was spent on counseling and coordination of care. Return in about 3 months (around 10/25/2017).  Reva Bores 07/26/2017 2:10 PM

## 2017-07-26 NOTE — Assessment & Plan Note (Signed)
Trial of HCTZ

## 2017-07-26 NOTE — Telephone Encounter (Signed)
Call to check on pt mood; left HIPPA-compliant message to return call to [place] at [facility] for [facility], at [phone].

## 2017-08-18 DIAGNOSIS — E559 Vitamin D deficiency, unspecified: Secondary | ICD-10-CM | POA: Diagnosis not present

## 2017-08-18 DIAGNOSIS — E1165 Type 2 diabetes mellitus with hyperglycemia: Secondary | ICD-10-CM | POA: Diagnosis not present

## 2017-08-18 DIAGNOSIS — E669 Obesity, unspecified: Secondary | ICD-10-CM | POA: Diagnosis not present

## 2017-08-23 ENCOUNTER — Other Ambulatory Visit: Payer: Self-pay | Admitting: Internal Medicine

## 2017-08-23 ENCOUNTER — Ambulatory Visit
Admission: RE | Admit: 2017-08-23 | Discharge: 2017-08-23 | Disposition: A | Discharge status: Home / Self Care | Payer: BLUE CROSS/BLUE SHIELD | Source: Ambulatory Visit | Attending: Internal Medicine | Admitting: Internal Medicine

## 2017-08-23 DIAGNOSIS — R0789 Other chest pain: Secondary | ICD-10-CM

## 2017-08-23 DIAGNOSIS — E669 Obesity, unspecified: Secondary | ICD-10-CM | POA: Diagnosis not present

## 2017-08-23 DIAGNOSIS — Z794 Long term (current) use of insulin: Secondary | ICD-10-CM | POA: Diagnosis not present

## 2017-08-23 DIAGNOSIS — Z5181 Encounter for therapeutic drug level monitoring: Secondary | ICD-10-CM | POA: Diagnosis not present

## 2017-08-23 DIAGNOSIS — E1165 Type 2 diabetes mellitus with hyperglycemia: Secondary | ICD-10-CM | POA: Diagnosis not present

## 2017-08-30 ENCOUNTER — Telehealth: Payer: Self-pay

## 2017-08-30 NOTE — Telephone Encounter (Signed)
NOTES FAXED TO [facility] °

## 2017-09-11 ENCOUNTER — Telehealth: Payer: Self-pay | Admitting: Cardiology

## 2017-09-11 NOTE — Telephone Encounter (Signed)
[facility] on 09/13/17 @ 9:40am with Dr. Herbie BaltimoreHarding. [facility] 09/11/17 ab

## 2017-09-13 ENCOUNTER — Ambulatory Visit: Payer: BLUE CROSS/BLUE SHIELD | Admitting: Cardiology

## 2017-10-18 ENCOUNTER — Other Ambulatory Visit: Payer: Self-pay | Admitting: Family Medicine

## 2017-10-18 DIAGNOSIS — F331 Major depressive disorder, recurrent, moderate: Secondary | ICD-10-CM

## 2017-11-17 ENCOUNTER — Other Ambulatory Visit: Payer: Self-pay | Admitting: Family Medicine

## 2017-11-17 DIAGNOSIS — I10 Essential (primary) hypertension: Secondary | ICD-10-CM

## 2017-12-04 ENCOUNTER — Other Ambulatory Visit: Payer: Self-pay | Admitting: Family Medicine

## 2017-12-04 DIAGNOSIS — Z139 Encounter for screening, unspecified: Secondary | ICD-10-CM

## 2017-12-08 ENCOUNTER — Ambulatory Visit
Admission: RE | Admit: 2017-12-08 | Discharge: 2017-12-08 | Disposition: A | Discharge status: Home / Self Care | Payer: BLUE CROSS/BLUE SHIELD | Source: Ambulatory Visit | Attending: Family Medicine | Admitting: Family Medicine

## 2017-12-08 DIAGNOSIS — Z139 Encounter for screening, unspecified: Secondary | ICD-10-CM

## 2017-12-08 DIAGNOSIS — Z1231 Encounter for screening mammogram for malignant neoplasm of breast: Secondary | ICD-10-CM | POA: Diagnosis not present

## 2017-12-12 DIAGNOSIS — E669 Obesity, unspecified: Secondary | ICD-10-CM | POA: Diagnosis not present

## 2017-12-12 DIAGNOSIS — Z5181 Encounter for therapeutic drug level monitoring: Secondary | ICD-10-CM | POA: Diagnosis not present

## 2017-12-12 DIAGNOSIS — Z794 Long term (current) use of insulin: Secondary | ICD-10-CM | POA: Diagnosis not present

## 2017-12-12 DIAGNOSIS — E1165 Type 2 diabetes mellitus with hyperglycemia: Secondary | ICD-10-CM | POA: Diagnosis not present

## 2017-12-27 ENCOUNTER — Ambulatory Visit: Payer: BLUE CROSS/BLUE SHIELD | Admitting: Family Medicine

## 2018-01-25 ENCOUNTER — Other Ambulatory Visit: Payer: Self-pay | Admitting: Family Medicine

## 2018-01-25 DIAGNOSIS — F331 Major depressive disorder, recurrent, moderate: Secondary | ICD-10-CM

## 2018-01-27 ENCOUNTER — Other Ambulatory Visit: Payer: Self-pay

## 2018-01-27 ENCOUNTER — Ambulatory Visit (HOSPITAL_COMMUNITY)
Admission: EM | Admit: 2018-01-27 | Discharge: 2018-01-27 | Disposition: A | Discharge status: Home / Self Care | Payer: BLUE CROSS/BLUE SHIELD | Attending: Family Medicine | Admitting: Family Medicine

## 2018-01-27 ENCOUNTER — Encounter (HOSPITAL_COMMUNITY): Payer: Self-pay

## 2018-01-27 DIAGNOSIS — E559 Vitamin D deficiency, unspecified: Secondary | ICD-10-CM | POA: Insufficient documentation

## 2018-01-27 DIAGNOSIS — F331 Major depressive disorder, recurrent, moderate: Secondary | ICD-10-CM | POA: Insufficient documentation

## 2018-01-27 DIAGNOSIS — Z794 Long term (current) use of insulin: Secondary | ICD-10-CM | POA: Insufficient documentation

## 2018-01-27 DIAGNOSIS — E119 Type 2 diabetes mellitus without complications: Secondary | ICD-10-CM | POA: Insufficient documentation

## 2018-01-27 DIAGNOSIS — Z79899 Other long term (current) drug therapy: Secondary | ICD-10-CM | POA: Insufficient documentation

## 2018-01-27 DIAGNOSIS — K219 Gastro-esophageal reflux disease without esophagitis: Secondary | ICD-10-CM | POA: Insufficient documentation

## 2018-01-27 DIAGNOSIS — J029 Acute pharyngitis, unspecified: Secondary | ICD-10-CM | POA: Diagnosis not present

## 2018-01-27 DIAGNOSIS — Z90710 Acquired absence of both cervix and uterus: Secondary | ICD-10-CM | POA: Insufficient documentation

## 2018-01-27 DIAGNOSIS — E669 Obesity, unspecified: Secondary | ICD-10-CM | POA: Insufficient documentation

## 2018-01-27 DIAGNOSIS — Z833 Family history of diabetes mellitus: Secondary | ICD-10-CM | POA: Insufficient documentation

## 2018-01-27 DIAGNOSIS — I10 Essential (primary) hypertension: Secondary | ICD-10-CM | POA: Insufficient documentation

## 2018-01-27 HISTORY — DX: Essential (primary) hypertension: I10

## 2018-01-27 LAB — POCT RAPID STREP A: Streptococcus, Group A Screen (Direct): NEGATIVE

## 2018-01-27 NOTE — ED Triage Notes (Signed)
Pt presents with sore throat and body aches x 4 days.

## 2018-01-27 NOTE — ED Provider Notes (Signed)
[facility]    CSN: [id number] Arrival date & time: 01/27/18  1700     History   Chief Complaint Chief Complaint  Patient presents with  . Sore Throat    HPI Christy Cunningham is a 47 y.o. female.   47 yo female here for sore throat and body aches x 4 days. She works in [employer] but no specific known sick contacts. Nothing makes better or worse. Has not tried anything for symptoms.     Past Medical History:  Diagnosis Date  . Diabetes mellitus   . Hypertension     Patient Active Problem List   Diagnosis Date Noted  . Essential hypertension 07/26/2017  . Moderate episode of recurrent major depressive disorder (HCC) 07/26/2017  . Sinusitis 05/20/2016  . Obesity 09/02/2015  . Vitamin D deficiency 10/22/2014  . Migraines 04/01/2014  . Diabetes mellitus (HCC) 05/01/2012  . GERD (gastroesophageal reflux disease) 05/01/2012    Past Surgical History:  Procedure Laterality Date  . ABDOMINAL HYSTERECTOMY      OB History   None      Home Medications    Prior to Admission medications   Medication Sig Start Date End Date Taking? Authorizing Provider  buPROPion (WELLBUTRIN XL) 150 MG 24 hr tablet TAKE 1 TABLET BY MOUTH ONCE DAILY 01/25/18  Yes Reva BoresPratt, Tanya S, MD  cetirizine (ZYRTEC) 10 MG tablet Take 1 tablet (10 mg total) by mouth daily. 12/07/15  Yes Wallis BambergMani, Mario, PA-C  hydrochlorothiazide (HYDRODIURIL) 25 MG tablet TAKE 1 TABLET BY MOUTH ONCE DAILY 11/20/17  Yes Reva BoresPratt, Tanya S, MD  insulin degludec (TRESIBA FLEXTOUCH) 100 UNIT/ML SOPN FlexTouch Pen Inject into the skin at bedtime.    Yes [provider]  metFORMIN (GLUCOPHAGE) 500 MG tablet Take by mouth 2 (two) times daily with a meal.   Yes [provider]  omeprazole (PRILOSEC OTC) 20 MG tablet Take 1 tablet (20 mg total) by mouth daily. 09/02/15  Yes Reva BoresPratt, Tanya S, MD  fluticasone (FLONASE) 50 MCG/ACT nasal spray Place 2 sprays into both nostrils daily. 05/20/16   Joanna Pufforsey, Crystal S,  MD    Family History Family History  Problem Relation Age of Onset  . Diabetes Sister   . Healthy Mother   . Healthy Father     Social History Social History   Tobacco Use  . Smoking status: Never Smoker  . Smokeless tobacco: Never Used  Substance Use Topics  . Alcohol use: No  . Drug use: No     Allergies   Patient has no known allergies.   Review of Systems Review of Systems  Constitutional: Negative for activity change and appetite change.  HENT: Positive for sore throat. Negative for congestion and ear discharge.   Eyes: Negative for discharge and itching.  Respiratory: Negative for apnea and choking.   Cardiovascular: Negative for chest pain and leg swelling.  Gastrointestinal: Negative for abdominal distention and abdominal pain.  Endocrine: Negative for cold intolerance and heat intolerance.  Genitourinary: Negative for difficulty urinating and dysuria.  Musculoskeletal: Positive for back pain.  Hematological: Negative for adenopathy. Does not bruise/bleed easily.     Physical Exam Triage Vital Signs ED Triage Vitals [01/27/18 1739]  Enc Vitals Group     BP (!) 149/97     Pulse Rate (!) 107     Resp 18     Temp 98 F (36.7 C)     Temp src      SpO2 99 %  Weight 240 lb (108.9 kg)     Height      Head Circumference      Peak Flow      Pain Score 7     Pain Loc      Pain Edu?      Excl. in GC?    No data found.  Updated Vital Signs BP (!) 149/97   Pulse (!) 107   Temp 98 F (36.7 C)   Resp 18   Wt 240 lb (108.9 kg)   SpO2 99%   BMI 38.74 kg/m   Visual Acuity Right Eye Distance:   Left Eye Distance:   Bilateral Distance:    Right Eye Near:   Left Eye Near:    Bilateral Near:     Physical Exam CONSTITUTIONAL: Well-developed, well-nourished female in no acute distress.  EYES: EOM intact, conjunctivae normal, no scleral icterus HEAD: Normocephalic, atraumatic ENT: External right and left ear normal, oropharynx is clear and  moist. Mild tonsillar erythema and no exudate. TMs normal bilaterally CARDIOVASCULAR: No cyanosis or edema. 2+ distal pulses.  RESPIRATORY:Effort and breath sounds normal, no problems with respiration noted. MUSCULOSKELETAL: Normal range of motion. No tenderness. SKIN: Skin is warm and dry. No rash noted. Not diaphoretic. No erythema. No pallor. NEUROLGIC: Alert and oriented to person, place, and time. Normal reflexes, muscle tone, coordination. No cranial nerve deficit noted. PSYCHIATRIC: Normal mood and affect. Normal behavior. Normal judgment and thought content. HEM/LYMPH/IMMUNOLOGIC: Neck supple, no masses.  Normal thyroid.    UC Treatments / Results  Labs (all labs ordered are listed, but only abnormal results are displayed) Labs Reviewed - No data to display  EKG None Radiology No results found.  Procedures Procedures (including critical care time)  Medications Ordered in UC Medications - No data to display   Initial Impression / Assessment and Plan / UC Course  I have reviewed the triage vital signs and the nursing notes.  Pertinent labs & imaging results that were available during my care of the patient were reviewed by me and considered in my medical decision making (see chart for details).     1. Pharyngitis- rapid strep neg. Advised supportive care.  Final Clinical Impressions(s) / UC Diagnoses   Final diagnoses:  None    ED Discharge Orders    None       Controlled Substance Prescriptions [place] Controlled Substance Registry consulted? Not Applicable   Rolm Bookbinder, DO 01/27/18 1830

## 2018-01-30 LAB — CULTURE, GROUP A STREP (THRC)

## 2018-02-01 ENCOUNTER — Encounter: Payer: Self-pay | Admitting: Medical

## 2018-02-01 ENCOUNTER — Ambulatory Visit: Payer: Self-pay | Admitting: Medical

## 2018-02-01 VITALS — BP 122/90 | HR 97 | Temp 98.2°F | Wt 248.0 lb

## 2018-02-01 DIAGNOSIS — H5789 Other specified disorders of eye and adnexa: Secondary | ICD-10-CM

## 2018-02-01 DIAGNOSIS — H1031 Unspecified acute conjunctivitis, right eye: Secondary | ICD-10-CM

## 2018-02-01 DIAGNOSIS — J029 Acute pharyngitis, unspecified: Secondary | ICD-10-CM

## 2018-02-01 MED ORDER — AMOXICILLIN-POT CLAVULANATE 875-125 MG PO TABS: 1.0000 | ORAL_TABLET | Freq: Two times a day (BID) | ORAL | 0 refills | Status: DC

## 2018-02-01 MED ORDER — CIPROFLOXACIN HCL 0.3 % OP SOLN: OPHTHALMIC | 0 refills | Status: DC

## 2018-02-01 MED ORDER — FLUORESCEIN SODIUM 0.6 MG OP STRP: 1.0000 | ORAL_STRIP | Freq: Once | OPHTHALMIC | Status: DC

## 2018-02-01 NOTE — Progress Notes (Signed)
Patient in office today complain of right eye drainage and clumped sx this morning OTC: none

## 2018-02-01 NOTE — Patient Instructions (Addendum)
Wash hand before and after applying eye medication. Soft foods avoid acidic foods. otc Motrin or Tylenol for pain take as directed.  Bacterial Conjunctivitis Bacterial conjunctivitis is an infection of your conjunctiva. This is the clear membrane that covers the white part of your eye and the inner surface of your eyelid. This condition can make your eye:  Red or pink.  Itchy.  This condition is caused by bacteria. This condition spreads very easily from person to person (is contagious) and from one eye to the other eye. Follow these instructions at home: Medicines  Take or apply your antibiotic medicine as told by your doctor. Do not stop taking or applying the antibiotic even if you start to feel better.  Take or apply over-the-counter and prescription medicines only as told by your doctor.  Do not touch your eyelid with the eye drop bottle or the ointment tube. Managing discomfort  Wipe any fluid from your eye with a warm, wet washcloth or a cotton ball.  Place a cool, clean washcloth on your eye. Do this for 10-20 minutes, 3-4 times per day. General instructions  Do not wear contact lenses until the irritation is gone. Wear glasses until your doctor says it is okay to wear contacts.  Do not wear eye makeup until your symptoms are gone. Throw away any old makeup.  Change or wash your pillowcase every day.  Do not share towels or washcloths with anyone.  Wash your hands often with soap and water. Use paper towels to dry your hands.  Do not touch or rub your eyes.  Do not drive or use heavy machinery if your vision is blurry. Contact a doctor if:  You have a fever.  Your symptoms do not get better after 10 days. Get help right away if:  You have a fever and your symptoms suddenly get worse.  You have very bad pain when you move your eye.  Your face: ? Hurts. ? Is red. ? Is swollen.  You have sudden loss of vision. This information is not intended to replace  advice given to you by your health care provider. Make sure you discuss any questions you have with your health care provider. Document Released: 07/12/2008 Document Revised: 03/10/2016 Document Reviewed: 07/16/2015 Elsevier Interactive Patient Education  2018 [employer]. Pharyngitis Pharyngitis is a sore throat (pharynx). There is redness, pain, and swelling of your throat. Follow these instructions at home:  Drink enough fluids to keep your pee (urine) clear or pale yellow.  Only take medicine as told by your doctor. ? You may get sick again if you do not take medicine as told. Finish your medicines, even if you start to feel better. ? Do not take aspirin.  Rest.  Rinse your mouth (gargle) with salt water ( tsp of salt per 1 qt of water) every 1-2 hours. This will help the pain.  If you are not at risk for choking, you can suck on hard candy or sore throat lozenges. Contact a doctor if:  You have large, tender lumps on your neck.  You have a rash.  You cough up green, yellow-brown, or bloody spit. Get help right away if:  You have a stiff neck.  You drool or cannot swallow liquids.  You throw up (vomit) or are not able to keep medicine or liquids down.  You have very bad pain that does not go away with medicine.  You have problems breathing (not from a stuffy nose). This information is not  intended to replace advice given to you by your health care provider. Make sure you discuss any questions you have with your health care provider. Document Released: 03/21/2008 Document Revised: 03/10/2016 Document Reviewed: 06/10/2013 Elsevier Interactive Patient Education  2017 [employer].

## 2018-02-01 NOTE — Progress Notes (Signed)
Subjective:    Patient ID: Christy Cunningham, female    DOB: July 19, 1971, 47 y.o.   MRN: [id number]  HPI 47 yo female in non acute distress. Presents today with complaints of  waking up this morning with white discharge on the right eye. Denies visual change, no pain, twicthing a little bit . "Mild discomfort" rates it a   5/10.  Cold symptoms on Tuesday nasal congestion, runny nose at times clear. No ear pain or popping but initially had popping in ears a week ago. Seen at Urgent Care at [facility] in [place] told she had fluid in ears and it would resolve. Still has painful throat Seen in the ED 01/27/18 strep test negative per patient . Takes Zyrtec daily for allergies.    Review of Systems  Constitutional: Positive for diaphoresis. Negative for chills and fever.  HENT: Positive for congestion, postnasal drip, rhinorrhea, sore throat and voice change (deeper per patient). Negative for ear pain, nosebleeds, sinus pressure and sinus pain.   Eyes: Positive for pain, discharge and redness. Negative for photophobia, itching and visual disturbance.  Respiratory: Positive for cough (nonproductive). Negative for shortness of breath.   Cardiovascular: Negative for chest pain.  Gastrointestinal: Negative for abdominal pain.   Denies any sperm in eye.    Objective:   Physical Exam  Constitutional: She is oriented to person, place, and time. She appears well-developed and well-nourished.  HENT:  Head: Normocephalic and atraumatic.  Right Ear: Hearing, external ear and ear canal normal. A middle ear effusion is present.  Left Ear: Hearing, external ear and ear canal normal. A middle ear effusion is present.  Nose: Mucosal edema present.  Mouth/Throat: Uvula is midline. Uvula swelling present. Posterior oropharyngeal edema (mild) and posterior oropharyngeal erythema present. No oropharyngeal exudate or tonsillar abscesses.  Eyes: Pupils are equal, round, and reactive to light. EOM and lids are normal.  Right eye exhibits chemosis (mild, also tearing). Right eye exhibits no discharge, no exudate and no hordeolum. No foreign body present in the right eye. Right conjunctiva is injected. Right conjunctiva has no hemorrhage. Left conjunctiva is not injected. Left conjunctiva has no hemorrhage. No scleral icterus. Right eye exhibits normal extraocular motion and no nystagmus.  Fundoscopic exam:      The right eye shows red reflex.       The left eye shows red reflex.  Neck: Normal range of motion. Neck supple.  Cardiovascular: Normal rate, regular rhythm and normal heart sounds.  Pulmonary/Chest: Effort normal and breath sounds normal.  Lymphadenopathy:    She has no cervical adenopathy.  Neurological: She is alert and oriented to person, place, and time.  Skin: Skin is warm and dry.  Psychiatric: She has a normal mood and affect. Her behavior is normal. Judgment and thought content normal.  Nursing note and vitals reviewed.   L20/20 R20/20 both 20/25 uncorrected.  Right eye flouroscein stain negative dye uptake with woods lamp.    Assessment & Plan:  Bacterial conjunctivitis right eye Pharyngitis Dilute salt water gargles , soft food, avoid acidic foods. OTC Motrin or Tylenol take as directed for throat pain. Meds ordered this encounter  Medications  . amoxicillin-clavulanate (AUGMENTIN) 875-125 MG tablet    Sig: Take 1 tablet by mouth 2 (two) times daily.    Dispense:  20 tablet    Refill:  0  . ciprofloxacin (CILOXAN) 0.3 % ophthalmic solution    Sig: Administer 1 drop, every 4 hours, while awake to the right eye, for the  next 5 days.    Dispense:  5 mL    Refill:  0  Reviewed hygiene with patient for conjunctivitis. Patient to return in 3-5 days if not improving. Patient verbalizes understanding and has no questions at discharge. Ellie LunchHeather Terriah Reggio PA-C

## 2018-02-05 ENCOUNTER — Telehealth: Payer: Self-pay | Admitting: Emergency Medicine

## 2018-02-05 NOTE — Telephone Encounter (Signed)
Left message follow up from [facility]

## 2018-03-26 ENCOUNTER — Other Ambulatory Visit: Payer: Self-pay | Admitting: Family Medicine

## 2018-03-26 DIAGNOSIS — F331 Major depressive disorder, recurrent, moderate: Secondary | ICD-10-CM

## 2018-03-26 MED ORDER — BUPROPION HCL ER (XL) 150 MG PO TB24: 150.0000 mg | ORAL_TABLET | Freq: Every day | ORAL | 3 refills | Status: DC

## 2018-03-28 ENCOUNTER — Other Ambulatory Visit: Payer: Self-pay

## 2018-03-28 DIAGNOSIS — I10 Essential (primary) hypertension: Secondary | ICD-10-CM

## 2018-03-28 MED ORDER — HYDROCHLOROTHIAZIDE 25 MG PO TABS: 25.0000 mg | ORAL_TABLET | Freq: Every day | ORAL | 0 refills | Status: DC

## 2018-03-28 NOTE — Telephone Encounter (Signed)
Out of HCTZ. Making appt with PCP. Refill x one month sent until appt. Ples SpecterAlisa Rolly Magri, RN [facility] RN)

## 2018-04-02 DIAGNOSIS — Z5181 Encounter for therapeutic drug level monitoring: Secondary | ICD-10-CM | POA: Diagnosis not present

## 2018-04-02 DIAGNOSIS — Z794 Long term (current) use of insulin: Secondary | ICD-10-CM | POA: Diagnosis not present

## 2018-04-02 DIAGNOSIS — E1165 Type 2 diabetes mellitus with hyperglycemia: Secondary | ICD-10-CM | POA: Diagnosis not present

## 2018-04-02 DIAGNOSIS — E669 Obesity, unspecified: Secondary | ICD-10-CM | POA: Diagnosis not present

## 2018-04-18 ENCOUNTER — Encounter: Payer: Self-pay | Admitting: Family Medicine

## 2018-04-18 ENCOUNTER — Other Ambulatory Visit: Payer: Self-pay

## 2018-04-18 ENCOUNTER — Ambulatory Visit: Payer: BLUE CROSS/BLUE SHIELD | Admitting: Family Medicine

## 2018-04-18 VITALS — BP 110/80 | HR 91 | Temp 98.2°F | Wt 248.0 lb

## 2018-04-18 DIAGNOSIS — Z794 Long term (current) use of insulin: Secondary | ICD-10-CM

## 2018-04-18 DIAGNOSIS — Z23 Encounter for immunization: Secondary | ICD-10-CM

## 2018-04-18 DIAGNOSIS — I10 Essential (primary) hypertension: Secondary | ICD-10-CM

## 2018-04-18 DIAGNOSIS — F331 Major depressive disorder, recurrent, moderate: Secondary | ICD-10-CM

## 2018-04-18 DIAGNOSIS — E1169 Type 2 diabetes mellitus with other specified complication: Secondary | ICD-10-CM

## 2018-04-18 DIAGNOSIS — K219 Gastro-esophageal reflux disease without esophagitis: Secondary | ICD-10-CM

## 2018-04-18 LAB — POCT GLYCOSYLATED HEMOGLOBIN (HGB A1C): HbA1c, POC (controlled diabetic range): 8.3 % — AB (ref 0.0–7.0)

## 2018-04-18 MED ORDER — HYDROCHLOROTHIAZIDE 25 MG PO TABS: 25.0000 mg | ORAL_TABLET | Freq: Every day | ORAL | 3 refills | Status: DC

## 2018-04-18 MED ORDER — OMEPRAZOLE MAGNESIUM 20 MG PO TBEC: 20.0000 mg | DELAYED_RELEASE_TABLET | Freq: Every day | ORAL | 3 refills | Status: AC

## 2018-04-18 MED ORDER — BUPROPION HCL ER (XL) 150 MG PO TB24: 150.0000 mg | ORAL_TABLET | Freq: Every day | ORAL | 3 refills | Status: AC

## 2018-04-18 NOTE — Progress Notes (Signed)
  Subjective:    Patient here for follow-up of elevated blood pressure.  She is exercising and is not adherent to a low-salt diet.  Blood pressure is well controlled at home. Cardiac symptoms: none. Patient denies: chest pain and irregular heart beat. Cardiovascular risk factors: diabetes mellitus. Use of agents associated with hypertension: none. History of target organ damage: none.  The following portions of the patient's history were reviewed and updated as appropriate: allergies, current medications, past family history, past medical history, past social history, past surgical history and problem list.  Review of Systems Pertinent items noted in HPI and remainder of comprehensive ROS otherwise negative.     Objective:    BP 110/80   Pulse 91   Temp 98.2 F (36.8 C) (Oral)   Wt 248 lb (112.5 kg)   SpO2 99%   BMI 40.03 kg/m   General Appearance:    Alert, cooperative, no distress, appears stated age  Head:    Normocephalic, without obvious abnormality, atraumatic  Eyes:    Sclera anicteric conjunctiva/corneas clear, EOM's intact  Neck:   Supple, symmetrical, trachea midline, no adenopathy;    thyroid:  no enlargement/tenderness/nodules; no carotid   bruit or JVD  Lungs:     Clear to auscultation bilaterally, respirations unlabored   Heart:    Regular rate and rhythm, S1 and S2 normal, no murmur, rub   or gallop  Abdomen:     Soft, non-tender, bowel sounds active all four quadrants,    no masses, no organomegaly  Extremities:   Extremities normal, atraumatic, no cyanosis or edema  Skin:   Skin color, texture, turgor normal, no rashes or lesions      Assessment:    Hypertension, normal blood pressure. Evidence of target organ damage: none.   Diabetes, managed by Endocrinology Plan:    Medication: no change. Follow up: 6 months and as needed.

## 2018-04-18 NOTE — Patient Instructions (Signed)
DASH Eating Plan DASH stands for "Dietary Approaches to Stop Hypertension." The DASH eating plan is a healthy eating plan that has been shown to reduce high blood pressure (hypertension). It may also reduce your risk for type 2 diabetes, heart disease, and stroke. The DASH eating plan may also help with weight loss. What are tips for following this plan? General guidelines  Avoid eating more than 2,300 mg (milligrams) of salt (sodium) a day. If you have hypertension, you may need to reduce your sodium intake to 1,500 mg a day.  Limit alcohol intake to no more than 1 drink a day for nonpregnant women and 2 drinks a day for men. One drink equals 12 oz of beer, 5 oz of wine, or 1 oz of hard liquor.  Work with your health care provider to maintain a healthy body weight or to lose weight. Ask what an ideal weight is for you.  Get at least 30 minutes of exercise that causes your heart to beat faster (aerobic exercise) most days of the week. Activities may include walking, swimming, or biking.  Work with your health care provider or diet and nutrition specialist (dietitian) to adjust your eating plan to your individual calorie needs. Reading food labels  Check food labels for the amount of sodium per serving. Choose foods with less than 5 percent of the Daily Value of sodium. Generally, foods with less than 300 mg of sodium per serving fit into this eating plan.  To find whole grains, look for the word "whole" as the first word in the ingredient list. Shopping  Buy products labeled as "low-sodium" or "no salt added."  Buy fresh foods. Avoid canned foods and premade or frozen meals. Cooking  Avoid adding salt when cooking. Use salt-free seasonings or herbs instead of table salt or sea salt. Check with your health care provider or pharmacist before using salt substitutes.  Do not fry foods. Cook foods using healthy methods such as baking, boiling, grilling, and broiling instead.  Cook with  heart-healthy oils, such as olive, canola, soybean, or sunflower oil. Meal planning   Eat a balanced diet that includes: ? 5 or more servings of fruits and vegetables each day. At each meal, try to fill half of your plate with fruits and vegetables. ? Up to 6-8 servings of whole grains each day. ? Less than 6 oz of lean meat, poultry, or fish each day. A 3-oz serving of meat is about the same size as a deck of cards. One egg equals 1 oz. ? 2 servings of low-fat dairy each day. ? A serving of nuts, seeds, or beans 5 times each week. ? Heart-healthy fats. Healthy fats called Omega-3 fatty acids are found in foods such as flaxseeds and coldwater fish, like sardines, salmon, and mackerel.  Limit how much you eat of the following: ? Canned or prepackaged foods. ? Food that is high in trans fat, such as fried foods. ? Food that is high in saturated fat, such as fatty meat. ? Sweets, desserts, sugary drinks, and other foods with added sugar. ? Full-fat dairy products.  Do not salt foods before eating.  Try to eat at least 2 vegetarian meals each week.  Eat more home-cooked food and less restaurant, buffet, and fast food.  When eating at a restaurant, ask that your food be prepared with less salt or no salt, if possible. What foods are recommended? The items listed may not be a complete list. Talk with your dietitian about what   dietary choices are best for you. Grains Whole-grain or whole-wheat bread. Whole-grain or whole-wheat pasta. Brown rice. Oatmeal. Quinoa. Bulgur. Whole-grain and low-sodium cereals. Pita bread. Low-fat, low-sodium crackers. Whole-wheat flour tortillas. Vegetables Fresh or frozen vegetables (raw, steamed, roasted, or grilled). Low-sodium or reduced-sodium tomato and vegetable juice. Low-sodium or reduced-sodium tomato sauce and tomato paste. Low-sodium or reduced-sodium canned vegetables. Fruits All fresh, dried, or frozen fruit. Canned fruit in natural juice (without  added sugar). Meat and other protein foods Skinless chicken or turkey. Ground chicken or turkey. Pork with fat trimmed off. Fish and seafood. Egg whites. Dried beans, peas, or lentils. Unsalted nuts, nut butters, and seeds. Unsalted canned beans. Lean cuts of beef with fat trimmed off. Low-sodium, lean deli meat. Dairy Low-fat (1%) or fat-free (skim) milk. Fat-free, low-fat, or reduced-fat cheeses. Nonfat, low-sodium ricotta or cottage cheese. Low-fat or nonfat yogurt. Low-fat, low-sodium cheese. Fats and oils Soft margarine without trans fats. Vegetable oil. Low-fat, reduced-fat, or light mayonnaise and salad dressings (reduced-sodium). Canola, safflower, olive, soybean, and sunflower oils. Avocado. Seasoning and other foods Herbs. Spices. Seasoning mixes without salt. Unsalted popcorn and pretzels. Fat-free sweets. What foods are not recommended? The items listed may not be a complete list. Talk with your dietitian about what dietary choices are best for you. Grains Baked goods made with fat, such as croissants, muffins, or some breads. Dry pasta or rice meal packs. Vegetables Creamed or fried vegetables. Vegetables in a cheese sauce. Regular canned vegetables (not low-sodium or reduced-sodium). Regular canned tomato sauce and paste (not low-sodium or reduced-sodium). Regular tomato and vegetable juice (not low-sodium or reduced-sodium). Pickles. Olives. Fruits Canned fruit in a light or heavy syrup. Fried fruit. Fruit in cream or butter sauce. Meat and other protein foods Fatty cuts of meat. Ribs. Fried meat. Bacon. Sausage. Bologna and other processed lunch meats. Salami. Fatback. Hotdogs. Bratwurst. Salted nuts and seeds. Canned beans with added salt. Canned or smoked fish. Whole eggs or egg yolks. Chicken or turkey with skin. Dairy Whole or 2% milk, cream, and half-and-half. Whole or full-fat cream cheese. Whole-fat or sweetened yogurt. Full-fat cheese. Nondairy creamers. Whipped toppings.  Processed cheese and cheese spreads. Fats and oils Butter. Stick margarine. Lard. Shortening. Ghee. Bacon fat. Tropical oils, such as coconut, palm kernel, or palm oil. Seasoning and other foods Salted popcorn and pretzels. Onion salt, garlic salt, seasoned salt, table salt, and sea salt. Worcestershire sauce. Tartar sauce. Barbecue sauce. Teriyaki sauce. Soy sauce, including reduced-sodium. Steak sauce. Canned and packaged gravies. Fish sauce. Oyster sauce. Cocktail sauce. Horseradish that you find on the shelf. Ketchup. Mustard. Meat flavorings and tenderizers. Bouillon cubes. Hot sauce and Tabasco sauce. Premade or packaged marinades. Premade or packaged taco seasonings. Relishes. Regular salad dressings. Where to find more information:  National Heart, Lung, and Blood Institute: www.nhlbi.nih.gov  American Heart Association: www.heart.org Summary  The DASH eating plan is a healthy eating plan that has been shown to reduce high blood pressure (hypertension). It may also reduce your risk for type 2 diabetes, heart disease, and stroke.  With the DASH eating plan, you should limit salt (sodium) intake to 2,300 mg a day. If you have hypertension, you may need to reduce your sodium intake to 1,500 mg a day.  When on the DASH eating plan, aim to eat more fresh fruits and vegetables, whole grains, lean proteins, low-fat dairy, and heart-healthy fats.  Work with your health care provider or diet and nutrition specialist (dietitian) to adjust your eating plan to your individual   calorie needs. This information is not intended to replace advice given to you by your health care provider. Make sure you discuss any questions you have with your health care provider. Document Released: 09/22/2011 Document Revised: 09/26/2016 Document Reviewed: 09/26/2016 Elsevier Interactive Patient Education  2018 Elsevier Inc.  

## 2018-06-20 DIAGNOSIS — H40033 Anatomical narrow angle, bilateral: Secondary | ICD-10-CM | POA: Diagnosis not present

## 2018-06-20 DIAGNOSIS — E119 Type 2 diabetes mellitus without complications: Secondary | ICD-10-CM | POA: Diagnosis not present

## 2018-06-20 DIAGNOSIS — H25013 Cortical age-related cataract, bilateral: Secondary | ICD-10-CM | POA: Diagnosis not present

## 2018-06-20 DIAGNOSIS — H40013 Open angle with borderline findings, low risk, bilateral: Secondary | ICD-10-CM | POA: Diagnosis not present

## 2018-06-20 LAB — HM DIABETES EYE EXAM

## 2018-06-30 ENCOUNTER — Encounter: Payer: Self-pay | Admitting: Adult Health

## 2018-06-30 ENCOUNTER — Ambulatory Visit (INDEPENDENT_AMBULATORY_CARE_PROVIDER_SITE_OTHER): Payer: Self-pay | Admitting: Adult Health

## 2018-06-30 VITALS — BP 130/96 | HR 92 | Temp 98.6°F | Wt 257.0 lb

## 2018-06-30 DIAGNOSIS — H6691 Otitis media, unspecified, right ear: Secondary | ICD-10-CM

## 2018-06-30 MED ORDER — AMOXICILLIN-POT CLAVULANATE 875-125 MG PO TABS: 1.0000 | ORAL_TABLET | Freq: Two times a day (BID) | ORAL | 0 refills | Status: DC

## 2018-06-30 NOTE — Progress Notes (Addendum)
Subjective:     Patient ID: Christy Cunningham, female   DOB: 12/19/1970, 47 y.o.   MRN: [id number]  HPI   Blood pressure (!) 130/96, pulse 92, temperature 98.6 F (37 C), weight 257 lb (116.6 kg), SpO2 96 %. Patient is a 47 year old female in no acute distress who comes the clinic started with sinus drainage and pressure on right side of face, teeth sensitivity right side all over, facial pressure right side near right ear and maxillary. Right sided ear pain.   White mucous- mildly productive cough- improving and " breaking up per patient"  Mild fatigue  Denies any injury or trauma. Denies any recurrent sinus infections or repeat ear infection. Mild chest congestion. Denies any shortness of breath or chest tightness.  Uses Flonase every day/ as well as Zyrtec.  Patient  denies any fever, body aches,chills, rash, chest pain, shortness of breath, nausea, vomiting, or diarrhea.   Patient Active Problem List   Diagnosis Date Noted  . Essential hypertension 07/26/2017  . Moderate episode of recurrent major depressive disorder (HCC) 07/26/2017  . Sinusitis 05/20/2016  . Obesity 09/02/2015  . Vitamin D deficiency 10/22/2014  . Migraines 04/01/2014  . Diabetes mellitus (HCC) 05/01/2012  . GERD (gastroesophageal reflux disease) 05/01/2012  ; No Known Allergies  No LMP recorded. Patient has had a hysterectomy.     Review of Systems  Constitutional: Positive for fatigue. Negative for activity change, appetite change, chills, diaphoresis, fever and unexpected weight change.  HENT: Positive for congestion, ear pain, postnasal drip, rhinorrhea, sinus pressure and sore throat. Negative for dental problem, drooling, ear discharge, facial swelling, hearing loss, mouth sores, nosebleeds, sinus pain, sneezing, tinnitus, trouble swallowing and voice change.   Respiratory: Negative.   Cardiovascular: Negative.   Gastrointestinal: Negative.   Genitourinary: Negative.   Musculoskeletal: Negative.    Skin: Negative.   Allergic/Immunologic: Positive for environmental allergies.  Neurological: Negative.   Hematological: Negative.   Psychiatric/Behavioral: Negative.        Objective:   Physical Exam  Constitutional: She is oriented to person, place, and time. She appears well-developed and well-nourished. She is active.  Non-toxic appearance. She does not have a sickly appearance. She does not appear ill. No distress.  Patient is alert and oriented and responsive to questions Engages in eye contact with provider. Speaks in full sentences without any pauses without any shortness of breath or distress.    HENT:  Head: Normocephalic and atraumatic.  Right Ear: Hearing, external ear and ear canal normal. Tympanic membrane is erythematous. Tympanic membrane is not perforated. A middle ear effusion (dark fluid yellow/ brown behind dull tympanic membrane ) is present.  Left Ear: Hearing, external ear and ear canal normal. Tympanic membrane is not perforated and not erythematous. A middle ear effusion (clear fluid behind shiny gray tympanic membrane ) is present.  Nose: Mucosal edema and rhinorrhea present. Right sinus exhibits maxillary sinus tenderness. Right sinus exhibits no frontal sinus tenderness. Left sinus exhibits no maxillary sinus tenderness and no frontal sinus tenderness.  Mouth/Throat: Uvula is midline and mucous membranes are normal. Posterior oropharyngeal erythema present. No oropharyngeal exudate, posterior oropharyngeal edema or tonsillar abscesses. Tonsils are 2+ on the right. Tonsils are 0 on the left. No tonsillar exudate.  Eyes: Pupils are equal, round, and reactive to light. Conjunctivae, EOM and lids are normal. Right eye exhibits no discharge. Left eye exhibits no discharge. No scleral icterus.  Neck: Trachea normal, normal range of motion and full passive range of  motion without pain. Neck supple. No JVD present. No tracheal deviation present. No Brudzinski's sign noted.   Cardiovascular: Normal rate, regular rhythm, normal heart sounds and intact distal pulses. Exam reveals no gallop and no friction rub.  No murmur heard. Pulmonary/Chest: Effort normal and breath sounds normal. No stridor. No respiratory distress. She has no wheezes. She has no rales. She exhibits no tenderness.  Abdominal: Soft. Bowel sounds are normal.  Musculoskeletal: Normal range of motion.  Lymphadenopathy:       Head (right side): No submental, no submandibular, no tonsillar, no preauricular, no posterior auricular and no occipital adenopathy present.       Head (left side): No submental, no submandibular, no tonsillar, no preauricular, no posterior auricular and no occipital adenopathy present.    She has no cervical adenopathy.  Neurological: She is alert and oriented to person, place, and time. She has normal strength.  Patient moves on and off of exam table and in room without difficulty. Gait is normal in hall and in room. Patient is oriented to person place time and situation. Patient answers questions appropriately and engages in conversation.   Skin: Skin is warm, dry and intact. Capillary refill takes less than 2 seconds. No rash noted. She is not diaphoretic. No erythema. No pallor.  Psychiatric: She has a normal mood and affect. Her speech is normal and behavior is normal. Judgment and thought content normal. Cognition and memory are normal.  Vitals reviewed.      Assessment:     Right otitis media, unspecified otitis media type      Plan:     Meds ordered this encounter  Medications  . amoxicillin-clavulanate (AUGMENTIN) 875-125 MG tablet    Sig: Take 1 tablet by mouth 2 (two) times daily.    Dispense:  20 tablet    Refill:  0   Advised patient call the office or your primary care doctor for an appointment if no improvement within 72 hours or if any symptoms change or worsen at any time  Advised ER or urgent Care if after hours or on weekend. Call 911 for  emergency symptoms at any time.Patinet verbalized understanding of all instructions given/reviewed and treatment plan and has no further questions or concerns at this time.    Patient verbalized understanding of all instructions given and denies any further questions at this time.

## 2018-06-30 NOTE — Patient Instructions (Signed)
Amoxicillin; Clavulanic Acid tablets What is this medicine? AMOXICILLIN; CLAVULANIC ACID (a mox i SIL in; KLAV yoo lan ic AS id) is a penicillin antibiotic. It is used to treat certain kinds of bacterial infections. It will not work for colds, flu, or other viral infections. This medicine may be used for other purposes; ask your health care provider or pharmacist if you have questions. COMMON BRAND NAME(S): Augmentin What should I tell my health care provider before I take this medicine? They need to know if you have any of these conditions: -bowel disease, like colitis -kidney disease -liver disease -mononucleosis -an unusual or allergic reaction to amoxicillin, penicillin, cephalosporin, other antibiotics, clavulanic acid, other medicines, foods, dyes, or preservatives -pregnant or trying to get pregnant -breast-feeding How should I use this medicine? Take this medicine by mouth with a full glass of water. Follow the directions on the prescription label. Take at the start of a meal. Do not crush or chew. If the tablet has a score line, you may cut it in half at the score line for easier swallowing. Take your medicine at regular intervals. Do not take your medicine more often than directed. Take all of your medicine as directed even if you think you are better. Do not skip doses or stop your medicine early. Talk to your pediatrician regarding the use of this medicine in children. Special care may be needed. Overdosage: If you think you have taken too much of this medicine contact a poison control center or emergency room at once. NOTE: This medicine is only for you. Do not share this medicine with others. What if I miss a dose? If you miss a dose, take it as soon as you can. If it is almost time for your next dose, take only that dose. Do not take double or extra doses. What may interact with this medicine? -allopurinol -anticoagulants -birth control pills -methotrexate -probenecid This  list may not describe all possible interactions. Give your health care provider a list of all the medicines, herbs, non-prescription drugs, or dietary supplements you use. Also tell them if you smoke, drink alcohol, or use illegal drugs. Some items may interact with your medicine. What should I watch for while using this medicine? Tell your doctor or health care professional if your symptoms do not improve. Do not treat diarrhea with over the counter products. Contact your doctor if you have diarrhea that lasts more than 2 days or if it is severe and watery. If you have diabetes, you may get a false-positive result for sugar in your urine. Check with your doctor or health care professional. Birth control pills may not work properly while you are taking this medicine. Talk to your doctor about using an extra method of birth control. What side effects may I notice from receiving this medicine? Side effects that you should report to your doctor or health care professional as soon as possible: -allergic reactions like skin rash, itching or hives, swelling of the face, lips, or tongue -breathing problems -dark urine -fever or chills, sore throat -redness, blistering, peeling or loosening of the skin, including inside the mouth -seizures -trouble passing urine or change in the amount of urine -unusual bleeding, bruising -unusually weak or tired -white patches or sores in the mouth or throat Side effects that usually do not require medical attention (report to your doctor or health care professional if they continue or are bothersome): -diarrhea -dizziness -headache -nausea, vomiting -stomach upset -vaginal or anal irritation This list may   not describe all possible side effects. Call your doctor for medical advice about side effects. You may report side effects to FDA at 1-800-FDA-1088. Where should I keep my medicine? Keep out of the reach of children. Store at room temperature below 25 degrees  C (77 degrees F). Keep container tightly closed. Throw away any unused medicine after the expiration date. NOTE: This sheet is a summary. It may not cover all possible information. If you have questions about this medicine, talk to your doctor, pharmacist, or health care provider.  2018 Elsevier/Gold Standard (2007-12-27 12:04:30)    Sinusitis, Adult Sinusitis is soreness and inflammation of your sinuses. Sinuses are hollow spaces in the bones around your face. They are located:  Around your eyes.  In the middle of your forehead.  Behind your nose.  In your cheekbones.  Your sinuses and nasal passages are lined with a stringy fluid (mucus). Mucus normally drains out of your sinuses. When your nasal tissues get inflamed or swollen, the mucus can get trapped or blocked so air cannot flow through your sinuses. This lets bacteria, viruses, and funguses grow, and that leads to infection. Follow these instructions at home: Medicines  Take, use, or apply over-the-counter and prescription medicines only as told by your doctor. These may include nasal sprays.  If you were prescribed an antibiotic medicine, take it as told by your doctor. Do not stop taking the antibiotic even if you start to feel better. Hydrate and Humidify  Drink enough water to keep your pee (urine) clear or pale yellow.  Use a cool mist humidifier to keep the humidity level in your home above 50%.  Breathe in steam for 10-15 minutes, 3-4 times a day or as told by your doctor. You can do this in the bathroom while a hot shower is running.  Try not to spend time in cool or dry air. Rest  Rest as much as possible.  Sleep with your head raised (elevated).  Make sure to get enough sleep each night. General instructions  Put a warm, moist washcloth on your face 3-4 times a day or as told by your doctor. This will help with discomfort.  Wash your hands often with soap and water. If there is no soap and water, use  hand sanitizer.  Do not smoke. Avoid being around people who are smoking (secondhand smoke).  Keep all follow-up visits as told by your doctor. This is important. Contact a doctor if:  You have a fever.  Your symptoms get worse.  Your symptoms do not get better within 10 days. Get help right away if:  You have a very bad headache.  You cannot stop throwing up (vomiting).  You have pain or swelling around your face or eyes.  You have trouble seeing.  You feel confused.  Your neck is stiff.  You have trouble breathing. This information is not intended to replace advice given to you by your health care provider. Make sure you discuss any questions you have with your health care provider. Document Released: 03/21/2008 Document Revised: 05/29/2016 Document Reviewed: 07/29/2015 Elsevier Interactive Patient Education  2018 [employer]. Otitis Media, Adult Otitis media is redness, soreness, and puffiness (swelling) in the space just behind your eardrum (middle ear). It may be caused by allergies or infection. It often happens along with a cold. Follow these instructions at home:  Take your medicine as told. Finish it even if you start to feel better.  Only take over-the-counter or prescription medicines for  pain, discomfort, or fever as told by your doctor.  Follow up with your doctor as told. Contact a doctor if:  You have otitis media only in one ear, or bleeding from your nose, or both.  You notice a lump on your neck.  You are not getting better in 3-5 days.  You feel worse instead of better. Get help right away if:  You have pain that is not helped with medicine.  You have puffiness, redness, or pain around your ear.  You get a stiff neck.  You cannot move part of your face (paralysis).  You notice that the bone behind your ear hurts when you touch it. This information is not intended to replace advice given to you by your health care provider. Make sure you  discuss any questions you have with your health care provider. Document Released: 03/21/2008 Document Revised: 03/10/2016 Document Reviewed: 04/30/2013 Elsevier Interactive Patient Education  2017 [employer]

## 2018-07-03 ENCOUNTER — Telehealth: Payer: Self-pay

## 2018-07-03 NOTE — Telephone Encounter (Signed)
I left a message asking the patient to call us back. 

## 2018-07-06 ENCOUNTER — Encounter: Payer: Self-pay | Admitting: Family Medicine

## 2018-08-26 ENCOUNTER — Other Ambulatory Visit: Payer: Self-pay

## 2018-08-26 ENCOUNTER — Emergency Department (HOSPITAL_COMMUNITY)
Admission: EM | Admit: 2018-08-26 | Discharge: 2018-08-26 | Disposition: A | Discharge status: Home / Self Care | Payer: BLUE CROSS/BLUE SHIELD | Attending: Emergency Medicine | Admitting: Emergency Medicine

## 2018-08-26 DIAGNOSIS — E161 Other hypoglycemia: Secondary | ICD-10-CM | POA: Diagnosis not present

## 2018-08-26 DIAGNOSIS — E11649 Type 2 diabetes mellitus with hypoglycemia without coma: Secondary | ICD-10-CM | POA: Insufficient documentation

## 2018-08-26 DIAGNOSIS — Z79899 Other long term (current) drug therapy: Secondary | ICD-10-CM | POA: Diagnosis not present

## 2018-08-26 DIAGNOSIS — R404 Transient alteration of awareness: Secondary | ICD-10-CM | POA: Diagnosis not present

## 2018-08-26 DIAGNOSIS — Z794 Long term (current) use of insulin: Secondary | ICD-10-CM | POA: Insufficient documentation

## 2018-08-26 DIAGNOSIS — I10 Essential (primary) hypertension: Secondary | ICD-10-CM | POA: Insufficient documentation

## 2018-08-26 DIAGNOSIS — E162 Hypoglycemia, unspecified: Secondary | ICD-10-CM | POA: Diagnosis not present

## 2018-08-26 DIAGNOSIS — R4020 Unspecified coma: Secondary | ICD-10-CM | POA: Diagnosis not present

## 2018-08-26 LAB — CBC WITH DIFFERENTIAL/PLATELET
Abs Immature Granulocytes: 0.05 10*3/uL (ref 0.00–0.07)
Basophils Absolute: 0 10*3/uL (ref 0.0–0.1)
Basophils Relative: 0 %
Eosinophils Absolute: 0 10*3/uL (ref 0.0–0.5)
Eosinophils Relative: 0 %
HCT: 45.5 % (ref 36.0–46.0)
Hemoglobin: 14.9 g/dL (ref 12.0–15.0)
Immature Granulocytes: 1 %
Lymphocytes Relative: 14 %
Lymphs Abs: 1.4 10*3/uL (ref 0.7–4.0)
MCH: 32.1 pg (ref 26.0–34.0)
MCHC: 32.7 g/dL (ref 30.0–36.0)
MCV: 98.1 fL (ref 80.0–100.0)
Monocytes Absolute: 0.5 10*3/uL (ref 0.1–1.0)
Monocytes Relative: 6 %
Neutro Abs: 7.7 10*3/uL (ref 1.7–7.7)
Neutrophils Relative %: 79 %
Platelets: 407 10*3/uL — ABNORMAL HIGH (ref 150–400)
RBC: 4.64 MIL/uL (ref 3.87–5.11)
RDW: 12.1 % (ref 11.5–15.5)
WBC: 9.7 10*3/uL (ref 4.0–10.5)
nRBC: 0 % (ref 0.0–0.2)

## 2018-08-26 LAB — COMPREHENSIVE METABOLIC PANEL
ALT: 28 U/L (ref 0–44)
AST: 24 U/L (ref 15–41)
Albumin: 3.5 g/dL (ref 3.5–5.0)
Alkaline Phosphatase: 71 U/L (ref 38–126)
Anion gap: 8 (ref 5–15)
BUN: 7 mg/dL (ref 6–20)
CO2: 29 mmol/L (ref 22–32)
Calcium: 9.3 mg/dL (ref 8.9–10.3)
Chloride: 102 mmol/L (ref 98–111)
Creatinine, Ser: 0.83 mg/dL (ref 0.44–1.00)
GFR calc Af Amer: >60 mL/min (ref >60)
GFR calc non Af Amer: >60 mL/min (ref >60)
Glucose, Bld: 94 mg/dL (ref 70–99)
Potassium: 3.9 mmol/L (ref 3.5–5.1)
Sodium: 139 mmol/L (ref 135–145)
Total Bilirubin: 0.5 mg/dL (ref 0.3–1.2)
Total Protein: 7.9 g/dL (ref 6.5–8.1)

## 2018-08-26 LAB — URINALYSIS, ROUTINE W REFLEX MICROSCOPIC
Bilirubin Urine: NEGATIVE
Glucose, UA: NEGATIVE mg/dL
Ketones, ur: NEGATIVE mg/dL
Leukocytes, UA: NEGATIVE
Nitrite: NEGATIVE
Protein, ur: NEGATIVE mg/dL
Specific Gravity, Urine: 1.006 (ref 1.005–1.030)
pH: 7 (ref 5.0–8.0)

## 2018-08-26 LAB — CBG MONITORING, ED
Glucose-Capillary: 77 mg/dL (ref 70–99)
Glucose-Capillary: 61 mg/dL — ABNORMAL LOW (ref 70–99)
Glucose-Capillary: 130 mg/dL — ABNORMAL HIGH (ref 70–99)

## 2018-08-26 LAB — I-STAT TROPONIN, ED: Troponin i, poc: 0 ng/mL (ref 0.00–0.08)

## 2018-08-26 LAB — I-STAT BETA HCG BLOOD, ED (MC, WL, AP ONLY): I-stat hCG, quantitative: <5 m[IU]/mL (ref <5)

## 2018-08-26 MED ORDER — SODIUM CHLORIDE 0.9 % IV BOLUS
1000.0000 mL | Freq: Once | INTRAVENOUS | Status: AC
  Administered 2018-08-26: 1000 mL via INTRAVENOUS

## 2018-08-26 NOTE — ED Notes (Signed)
Pt given [place] sandwich and apple sauce to eat

## 2018-08-26 NOTE — Discharge Instructions (Signed)
Stay hydrated.   Skip your diabetes medicines tonight.   Eat normally tomorrow and then you may take your medicines if you feel ok tomorrow  See your doctor in 2-3 days   Return to ER if you have lethargy, vomiting, fever, abdominal pain

## 2018-08-26 NOTE — ED Provider Notes (Signed)
[facility] EMERGENCY DEPARTMENT Provider Note   CSN: [id number] Arrival date & time: 08/26/18  1516     History   Chief Complaint Chief Complaint  Patient presents with  . Hypoglycemia    HPI Christy Cunningham is a 47 y.o. female hx of DM, HTN, recent otitis media finished course of abx, here with hypoglycemia.  Patient did take her Evaristo Bury, metformin last night and went to bed.  She states that she woke around 7 AM to use the bathroom and then went back to bed.  She then got up around 9 AM and then back to bed again.  Around 1 PM, husband tried to call her and noticed that she was altered and was drooling at the mouth and difficult to arouse.  Patient does not remember what happened.  EMS was called and patient was noted to have CBG that just read "low".  Patient was given D10 and cracker and orange juice and CBG was 104 on arrival per EMS.  Patient denies any nausea or vomiting.  She did not eat breakfast or lunch this morning.  Denies any fevers or chills or chest pain or trouble breathing or abdominal pain.  Patient denies any recent changes of her medicines.  Patient is not on any long-acting insulin or sulfonylureas.   The history is provided by the patient.    Past Medical History:  Diagnosis Date  . Diabetes mellitus   . Hypertension     Patient Active Problem List   Diagnosis Date Noted  . Essential hypertension 07/26/2017  . Moderate episode of recurrent major depressive disorder (HCC) 07/26/2017  . Sinusitis 05/20/2016  . Obesity 09/02/2015  . Vitamin D deficiency 10/22/2014  . Migraines 04/01/2014  . Diabetes mellitus (HCC) 05/01/2012  . GERD (gastroesophageal reflux disease) 05/01/2012    Past Surgical History:  Procedure Laterality Date  . ABDOMINAL HYSTERECTOMY       OB History   None      Home Medications    Prior to Admission medications   Medication Sig Start Date End Date Taking? Authorizing Provider  amoxicillin-clavulanate  (AUGMENTIN) 875-125 MG tablet Take 1 tablet by mouth 2 (two) times daily. 06/30/18   Flinchum, Eula Fried, FNP  buPROPion (WELLBUTRIN XL) 150 MG 24 hr tablet Take 1 tablet (150 mg total) by mouth daily. 04/18/18   Reva Bores, MD  cetirizine (ZYRTEC) 10 MG tablet Take 1 tablet (10 mg total) by mouth daily. 12/07/15   Wallis Bamberg, PA-C  fluticasone (FLONASE) 50 MCG/ACT nasal spray Place 2 sprays into both nostrils daily. 05/20/16   Joanna Puff, MD  hydrochlorothiazide (HYDRODIURIL) 25 MG tablet Take 1 tablet (25 mg total) by mouth daily. 04/18/18   Reva Bores, MD  metFORMIN (GLUCOPHAGE) 500 MG tablet Take by mouth 2 (two) times daily with a meal.    [provider]  omeprazole (PRILOSEC OTC) 20 MG tablet Take 1 tablet (20 mg total) by mouth daily. 04/18/18   Reva Bores, MD  TRESIBA FLEXTOUCH 200 UNIT/ML [facility]  12/12/17   [provider]    Family History Family History  Problem Relation Age of Onset  . Diabetes Sister   . Healthy Mother   . Healthy Father     Social History Social History   Tobacco Use  . Smoking status: Never Smoker  . Smokeless tobacco: Never Used  Substance Use Topics  . Alcohol use: No  . Drug use: No     Allergies  Patient has no known allergies.   Review of Systems Review of Systems  Psychiatric/Behavioral: Positive for confusion.  All other systems reviewed and are negative.    Physical Exam Updated Vital Signs BP (!) 152/91   Pulse 82   Resp 16   Ht 5\' 6"  (1.676 m)   Wt 117.9 kg   SpO2 100%   BMI 41.97 kg/m   Physical Exam  Constitutional: She is oriented to person, place, and time. She appears well-developed and well-nourished.  HENT:  Head: Normocephalic.  Mouth/Throat: Oropharynx is clear and moist.  No signs of head trauma   Eyes: Pupils are equal, round, and reactive to light. Conjunctivae and EOM are normal.  Neck: Normal range of motion. Neck supple.  Cardiovascular: Normal rate, regular rhythm and  normal heart sounds.  Pulmonary/Chest: Effort normal and breath sounds normal. No stridor. No respiratory distress.  Abdominal: Soft. Bowel sounds are normal. She exhibits no distension. There is no tenderness.  Musculoskeletal: Normal range of motion.  Neurological: She is alert and oriented to person, place, and time. No cranial nerve deficit. Coordination normal.  CN 2- 12 intact. Nl strength throughout   Skin: Skin is warm. Capillary refill takes less than 2 seconds.  Psychiatric: She has a normal mood and affect.  Nursing note and vitals reviewed.    ED Treatments / Results  Labs (all labs ordered are listed, but only abnormal results are displayed) Labs Reviewed  CBC WITH DIFFERENTIAL/PLATELET - Abnormal; Notable for the following components:      Result Value   Platelets 407 (*)    All other components within normal limits  URINALYSIS, ROUTINE W REFLEX MICROSCOPIC - Abnormal; Notable for the following components:   Color, Urine STRAW (*)    Hgb urine dipstick SMALL (*)    Bacteria, UA RARE (*)    All other components within normal limits  CBG MONITORING, ED - Abnormal; Notable for the following components:   Glucose-Capillary 61 (*)    All other components within normal limits  CBG MONITORING, ED - Abnormal; Notable for the following components:   Glucose-Capillary 130 (*)    All other components within normal limits  COMPREHENSIVE METABOLIC PANEL  CBG MONITORING, ED  I-STAT BETA HCG BLOOD, ED ([facility], [facility], [facility] ONLY)  I-STAT TROPONIN, ED    EKG EKG Interpretation  Date/Time:  Sunday August 26 2018 15:57:18 EST Ventricular Rate:  81 PR Interval:    QRS Duration: 92 QT Interval:  390 QTC Calculation: 453 R Axis:   32 Text Interpretation:  Sinus rhythm Borderline short PR interval Baseline wander in lead(s) V6 No significant change since last tracing Confirmed by Richardean Canal [phone]) on 08/26/2018 4:36:38 PM   Radiology No results found.  Procedures Procedures  (including critical care time)  Medications Ordered in ED Medications  sodium chloride 0.9 % bolus 1,000 mL (0 mLs Intravenous Stopped 08/26/18 1626)     Initial Impression / Assessment and Plan / ED Course  I have reviewed the triage vital signs and the nursing notes.  Pertinent labs & imaging results that were available during my care of the patient were reviewed by me and considered in my medical decision making (see chart for details).    Madia Carvell is a 47 y.o. female here with AMS, hypoglycemia. Didn't eat any breakfast or lunch today. No medicine changes. No signs of infection. Will check labs, PO trial.   7:01 PM CBG was 70 then went down to 61. She ate  a sandwich and it went back up to 130. Mentating well. Labs otherwise unremarkable. Stable for discharge. Told her to skip tonight's dose and eat normally tomorrow and then resume home meds tomorrow.     Final Clinical Impressions(s) / ED Diagnoses   Final diagnoses:  None    ED Discharge Orders    None       Charlynne Pander, MD 08/26/18 1902

## 2018-08-26 NOTE — ED Triage Notes (Signed)
Pt to ED from home via EMS for evaluation of hypoglycemic episode. Pt was inside by herself, her husband came inside and found her unresponsive, drooling. Hx of diabetes and htn, compliant with all meds. Initial blood sugar ran "low" 25 g D10, cracker, and orange juice given with improvement to 104. This has never happened to the patient before. A/O x4 on arrival.

## 2018-08-30 DIAGNOSIS — E11649 Type 2 diabetes mellitus with hypoglycemia without coma: Secondary | ICD-10-CM | POA: Diagnosis not present

## 2018-09-11 ENCOUNTER — Ambulatory Visit (HOSPITAL_COMMUNITY)
Admission: EM | Admit: 2018-09-11 | Discharge: 2018-09-11 | Disposition: A | Discharge status: Home / Self Care | Payer: BLUE CROSS/BLUE SHIELD | Attending: Family Medicine | Admitting: Family Medicine

## 2018-09-11 ENCOUNTER — Encounter (HOSPITAL_COMMUNITY): Payer: Self-pay

## 2018-09-11 ENCOUNTER — Telehealth (HOSPITAL_COMMUNITY): Payer: Self-pay

## 2018-09-11 DIAGNOSIS — E119 Type 2 diabetes mellitus without complications: Secondary | ICD-10-CM | POA: Diagnosis not present

## 2018-09-11 DIAGNOSIS — L03019 Cellulitis of unspecified finger: Secondary | ICD-10-CM

## 2018-09-11 DIAGNOSIS — L03011 Cellulitis of right finger: Secondary | ICD-10-CM | POA: Diagnosis not present

## 2018-09-11 DIAGNOSIS — I10 Essential (primary) hypertension: Secondary | ICD-10-CM | POA: Diagnosis not present

## 2018-09-11 MED ORDER — DOXYCYCLINE HYCLATE 100 MG PO TABS
ORAL_TABLET | ORAL | Status: AC
  Filled 2018-09-11: qty 1

## 2018-09-11 MED ORDER — HYDROCODONE-ACETAMINOPHEN 5-325 MG PO TABS: 1.0000 | ORAL_TABLET | ORAL | 0 refills | Status: AC | PRN

## 2018-09-11 MED ORDER — DOXYCYCLINE HYCLATE 100 MG PO TABS
100.0000 mg | ORAL_TABLET | Freq: Once | ORAL | Status: AC
  Administered 2018-09-11: 100 mg via ORAL

## 2018-09-11 MED ORDER — DOXYCYCLINE HYCLATE 100 MG PO CAPS: 100.0000 mg | ORAL_CAPSULE | Freq: Two times a day (BID) | ORAL | 0 refills | Status: DC

## 2018-09-11 NOTE — ED Provider Notes (Signed)
[facility]    CSN: [id number] Arrival date & time: 09/11/18  0830     History   Chief Complaint Chief Complaint  Patient presents with  . Hand Pain    Right Thumb    HPI Nikiyah Fackler is a 47 y.o. female.   HPI  Patient is here for an infection the side of her thumb.  Right thumb is swollen and very painful.  She states it kept her up last night.  She is a known diabetic.  No problems with recent blood sugars.  She has never had an infection like this before.  No trauma.  Or chills.  Past Medical History:  Diagnosis Date  . Diabetes mellitus   . Hypertension     Patient Active Problem List   Diagnosis Date Noted  . Essential hypertension 07/26/2017  . Moderate episode of recurrent major depressive disorder (HCC) 07/26/2017  . Sinusitis 05/20/2016  . Obesity 09/02/2015  . Vitamin D deficiency 10/22/2014  . Migraines 04/01/2014  . Diabetes mellitus (HCC) 05/01/2012  . GERD (gastroesophageal reflux disease) 05/01/2012    Past Surgical History:  Procedure Laterality Date  . ABDOMINAL HYSTERECTOMY      OB History   None      Home Medications    Prior to Admission medications   Medication Sig Start Date End Date Taking? Authorizing Provider  buPROPion (WELLBUTRIN XL) 150 MG 24 hr tablet Take 1 tablet (150 mg total) by mouth daily. 04/18/18   Reva Bores, MD  cetirizine (ZYRTEC) 10 MG tablet Take 1 tablet (10 mg total) by mouth daily. 12/07/15   Wallis Bamberg, PA-C  doxycycline (VIBRAMYCIN) 100 MG capsule Take 1 capsule (100 mg total) by mouth 2 (two) times daily. 09/11/18   Eustace Moore, MD  fluticasone [facility]) 50 MCG/ACT nasal spray Place 2 sprays into both nostrils daily. 05/20/16   Joanna Puff, MD  hydrochlorothiazide (HYDRODIURIL) 25 MG tablet Take 1 tablet (25 mg total) by mouth daily. 04/18/18   Reva Bores, MD  HYDROcodone-acetaminophen (NORCO/VICODIN) 5-325 MG tablet Take 1-2 tablets by mouth every 4 (four) hours as needed.  09/11/18   Eustace Moore, MD  metFORMIN (GLUCOPHAGE) 500 MG tablet Take by mouth 2 (two) times daily with a meal.    [provider]  omeprazole (PRILOSEC OTC) 20 MG tablet Take 1 tablet (20 mg total) by mouth daily. 04/18/18   Reva Bores, MD  TRESIBA FLEXTOUCH 200 UNIT/ML [facility]  12/12/17   [provider]    Family History Family History  Problem Relation Age of Onset  . Diabetes Sister   . Healthy Mother   . Healthy Father     Social History Social History   Tobacco Use  . Smoking status: Never Smoker  . Smokeless tobacco: Never Used  Substance Use Topics  . Alcohol use: No  . Drug use: No     Allergies   Patient has no known allergies.   Review of Systems Review of Systems   Physical Exam Triage Vital Signs ED Triage Vitals  Enc Vitals Group     BP 09/11/18 0850 (!) 145/95     Pulse Rate 09/11/18 0850 81     Resp 09/11/18 0850 18     Temp 09/11/18 0850 98.5 F (36.9 C)     Temp Source 09/11/18 0850 Oral     SpO2 09/11/18 0850 100 %     Weight --      Height --  Head Circumference --      Peak Flow --      Pain Score 09/11/18 0905 9     Pain Loc --      Pain Edu? --      Excl. in GC? --    No data found.  Updated Vital Signs BP (!) 145/95 (BP Location: Left Arm)   Pulse 81   Temp 98.5 F (36.9 C) (Oral)   Resp 18   SpO2 100%      Physical Exam  Constitutional: She appears well-developed and well-nourished. No distress.  HENT:  Head: Normocephalic and atraumatic.  Mouth/Throat: Oropharynx is clear and moist.  Eyes: Pupils are equal, round, and reactive to light. Conjunctivae are normal.  Neck: Normal range of motion.  Cardiovascular: Normal rate.  Pulmonary/Chest: Effort normal. No respiratory distress.  Abdominal: Soft. She exhibits no distension.  Musculoskeletal: Normal range of motion. She exhibits no edema.  Neurological: She is alert.  Skin: Skin is warm and dry.  Right thumb has a fluctuant paronychia at  the nail edge.  Erythema.  Swelling of the soft tissues.  With patient's permission the area was soaked in soapy water for 10 minutes and then the nail edge was elevated with an 18-gauge needle.  There was purulence that was expressed.  Patient voiced improvement in pain  Psychiatric: She has a normal mood and affect. Her behavior is normal.  Vitals reviewed.    UC Treatments / Results  Labs (all labs ordered are listed, but only abnormal results are displayed) Labs Reviewed - No data to display  EKG None  Radiology No results found.  Procedures Procedures (including critical care time)  Medications Ordered in UC Medications  doxycycline (VIBRA-TABS) tablet 100 mg (100 mg Oral Given 09/11/18 1007)    Initial Impression / Assessment and Plan / UC Course  I have reviewed the triage vital signs and the nursing notes.  Pertinent labs & imaging results that were available during my care of the patient were reviewed by me and considered in my medical decision making (see chart for details).     Paronychia.  Discussed treatment.  Soaks and antibiotics.  Watch sugars.  Return as needed Final Clinical Impressions(s) / UC Diagnoses   Final diagnoses:  Paronychia of thumb, unspecified laterality     Discharge Instructions     Warm soaks Take the antibiotic 2 x a day Take 2 doses today Take with food Take pain medicine as needed Do not drive on pain medicine    ED Prescriptions    Medication Sig Dispense Auth. Provider   doxycycline (VIBRAMYCIN) 100 MG capsule Take 1 capsule (100 mg total) by mouth 2 (two) times daily. 20 capsule Eustace MooreNelson, Hayzel Ruberg Sue, MD   HYDROcodone-acetaminophen (NORCO/VICODIN) 5-325 MG tablet Take 1-2 tablets by mouth every 4 (four) hours as needed. 10 tablet Eustace MooreNelson, Jonavan Vanhorn Sue, MD     Controlled Substance Prescriptions [place] Controlled Substance Registry consulted? Yes, I have consulted the [place] Controlled Substances Registry for this patient, and feel the  risk/benefit ratio today is favorable for proceeding with this prescription for a controlled substance.   Eustace MooreNelson, Sai Zinn Sue, MD 09/11/18 2126

## 2018-09-11 NOTE — ED Triage Notes (Signed)
Pt presents with swelling and pain in right thumb that she believes may be an infection from a hang nail

## 2018-09-11 NOTE — Discharge Instructions (Addendum)
Warm soaks Take the antibiotic 2 x a day Take 2 doses today Take with food Take pain medicine as needed Do not drive on pain medicine

## 2018-09-27 DIAGNOSIS — Z113 Encounter for screening for infections with a predominantly sexual mode of transmission: Secondary | ICD-10-CM | POA: Diagnosis not present

## 2018-09-27 DIAGNOSIS — E785 Hyperlipidemia, unspecified: Secondary | ICD-10-CM | POA: Diagnosis not present

## 2018-09-27 DIAGNOSIS — N760 Acute vaginitis: Secondary | ICD-10-CM | POA: Diagnosis not present

## 2018-09-27 DIAGNOSIS — Z01419 Encounter for gynecological examination (general) (routine) without abnormal findings: Secondary | ICD-10-CM | POA: Diagnosis not present

## 2018-09-27 DIAGNOSIS — E559 Vitamin D deficiency, unspecified: Secondary | ICD-10-CM | POA: Diagnosis not present

## 2018-09-27 DIAGNOSIS — Z6841 Body Mass Index (BMI) 40.0 and over, adult: Secondary | ICD-10-CM | POA: Diagnosis not present

## 2018-09-27 DIAGNOSIS — R7309 Other abnormal glucose: Secondary | ICD-10-CM | POA: Diagnosis not present

## 2019-01-21 DIAGNOSIS — E119 Type 2 diabetes mellitus without complications: Secondary | ICD-10-CM | POA: Diagnosis not present

## 2019-01-21 DIAGNOSIS — E669 Obesity, unspecified: Secondary | ICD-10-CM | POA: Diagnosis not present

## 2019-01-21 DIAGNOSIS — Z794 Long term (current) use of insulin: Secondary | ICD-10-CM | POA: Diagnosis not present

## 2019-01-21 DIAGNOSIS — Z5181 Encounter for therapeutic drug level monitoring: Secondary | ICD-10-CM | POA: Diagnosis not present

## 2019-01-22 ENCOUNTER — Encounter: Payer: Self-pay | Admitting: Radiology

## 2019-04-21 ENCOUNTER — Other Ambulatory Visit: Payer: Self-pay | Admitting: Family Medicine

## 2019-04-21 DIAGNOSIS — I10 Essential (primary) hypertension: Secondary | ICD-10-CM

## 2019-05-02 DIAGNOSIS — Z5181 Encounter for therapeutic drug level monitoring: Secondary | ICD-10-CM | POA: Diagnosis not present

## 2019-05-02 DIAGNOSIS — Z794 Long term (current) use of insulin: Secondary | ICD-10-CM | POA: Diagnosis not present

## 2019-05-02 DIAGNOSIS — E669 Obesity, unspecified: Secondary | ICD-10-CM | POA: Diagnosis not present

## 2019-05-02 DIAGNOSIS — E119 Type 2 diabetes mellitus without complications: Secondary | ICD-10-CM | POA: Diagnosis not present

## 2019-05-13 DIAGNOSIS — E119 Type 2 diabetes mellitus without complications: Secondary | ICD-10-CM | POA: Diagnosis not present

## 2019-05-15 ENCOUNTER — Other Ambulatory Visit: Payer: Self-pay | Admitting: Family Medicine

## 2019-05-15 DIAGNOSIS — Z1231 Encounter for screening mammogram for malignant neoplasm of breast: Secondary | ICD-10-CM

## 2019-06-11 ENCOUNTER — Ambulatory Visit (INDEPENDENT_AMBULATORY_CARE_PROVIDER_SITE_OTHER): Payer: BC Managed Care – PPO | Admitting: Family Medicine

## 2019-06-11 ENCOUNTER — Encounter: Payer: Self-pay | Admitting: Family Medicine

## 2019-06-11 ENCOUNTER — Other Ambulatory Visit: Payer: Self-pay

## 2019-06-11 VITALS — BP 124/70 | HR 94 | Ht 66.0 in | Wt 260.0 lb

## 2019-06-11 DIAGNOSIS — F419 Anxiety disorder, unspecified: Secondary | ICD-10-CM | POA: Diagnosis not present

## 2019-06-11 DIAGNOSIS — Z23 Encounter for immunization: Secondary | ICD-10-CM

## 2019-06-11 DIAGNOSIS — T733XXA Exhaustion due to excessive exertion, initial encounter: Secondary | ICD-10-CM

## 2019-06-11 DIAGNOSIS — Z6841 Body Mass Index (BMI) 40.0 and over, adult: Secondary | ICD-10-CM

## 2019-06-11 DIAGNOSIS — I10 Essential (primary) hypertension: Secondary | ICD-10-CM | POA: Diagnosis not present

## 2019-06-11 DIAGNOSIS — E1169 Type 2 diabetes mellitus with other specified complication: Secondary | ICD-10-CM

## 2019-06-11 DIAGNOSIS — Z794 Long term (current) use of insulin: Secondary | ICD-10-CM

## 2019-06-11 LAB — POCT GLYCOSYLATED HEMOGLOBIN (HGB A1C): HbA1c, POC (controlled diabetic range): 7.9 % — AB (ref 0.0–7.0)

## 2019-06-11 LAB — POCT UA - MICROALBUMIN
Albumin/Creatinine Ratio, Urine, POC: <30
Creatinine, POC: 200 mg/dL
Microalbumin Ur, POC: 30 mg/L

## 2019-06-11 MED ORDER — BUSPIRONE HCL 7.5 MG PO TABS: 7.5000 mg | ORAL_TABLET | Freq: Two times a day (BID) | ORAL | 3 refills | Status: AC

## 2019-06-11 NOTE — Assessment & Plan Note (Signed)
New--Trial of Buspar

## 2019-06-11 NOTE — Assessment & Plan Note (Signed)
For bariatric referral

## 2019-06-11 NOTE — Progress Notes (Signed)
   Subjective:    Patient ID: Christy Cunningham is a 48 y.o. female presenting with Diabetes  on 06/11/2019  HPI: Pt. Here today to discuss increasing anxiety. She notes ocasional palpitations. She is feeling anxious. On her Wellbutrin, which is helping her depression, but not her anxiety.  Review of Systems  Constitutional: Negative for chills and fever.  Respiratory: Negative for shortness of breath.   Cardiovascular: Negative for chest pain.  Gastrointestinal: Negative for abdominal pain, nausea and vomiting.  Genitourinary: Negative for dysuria.  Skin: Negative for rash.      Objective:    BP 124/70   Pulse 94   Ht 5\' 6"  (1.676 m)   Wt 260 lb (117.9 kg)   SpO2 97%   BMI 41.97 kg/m  Physical Exam Constitutional:      General: She is not in acute distress.    Appearance: She is well-developed.  HENT:     Head: Normocephalic and atraumatic.  Eyes:     General: No scleral icterus. Neck:     Musculoskeletal: Neck supple.  Cardiovascular:     Rate and Rhythm: Normal rate.  Pulmonary:     Effort: Pulmonary effort is normal.  Abdominal:     Palpations: Abdomen is soft.  Skin:    General: Skin is warm and dry.  Neurological:     Mental Status: She is alert and oriented to person, place, and time.         Assessment & Plan:   Problem List Items Addressed This Visit      Unprioritized   Diabetes mellitus ([place]) - Primary    On continuous glucose monitoring. She wants to have bariatric surgery. She is on Tresiba      Relevant Medications   lisinopril-hydrochlorothiazide (ZESTORETIC) 10-12.5 MG tablet   Other Relevant Orders   HgB A1c (Completed)   POCT UA - Microalbumin (Completed)   Amb Referral to Bariatric Surgery   TSH   Lipid panel   Obesity    For bariatric referral      Relevant Orders   Amb Referral to Bariatric Surgery   Essential hypertension    BP is well controlled on Zestoretic, please continue.      Relevant Medications   lisinopril-hydrochlorothiazide (ZESTORETIC) 10-12.5 MG tablet   Other Relevant Orders   Lipid panel   Anxiety    New--Trial of Buspar      Relevant Medications   busPIRone (BUSPAR) 7.5 MG tablet    Other Visit Diagnoses    Fatigue due to excessive exertion, initial encounter       Relevant Orders   CBC   VITAMIN D 25 Hydroxy (Vit-D Deficiency, Fractures)   Need for immunization against influenza       Relevant Orders   Flu Vaccine QUAD 36+ mos IM (Completed)      Total face-to-face time with patient: 25 minutes. Over 50% of encounter was spent on counseling and coordination of care. No follow-ups on file.  Donnamae Jude 06/11/2019 1:40 PM

## 2019-06-11 NOTE — Assessment & Plan Note (Signed)
On continuous glucose monitoring. She wants to have bariatric surgery. She is on [place]

## 2019-06-11 NOTE — Patient Instructions (Signed)
Endoscopic Sleeve Gastroplasty  Endoscopic sleeve gastroplasty is a weight loss (bariatric) procedure. It is done by inserting a long, flexible tube (endoscope) through the mouth and into the stomach. Through the endoscope, the surgeon applies surgical staples or stitches (sutures) to make the stomach smaller. This procedure does not require any incisions. You may need this procedure if you are very overweight and have not been able to lose enough weight with diet and exercise. Your health care provider may recommend this procedure if other types of bariatric surgery are not safe for you. You are likely to lose weight after this surgery because your stomach will be smaller. You will need to work closely with your health care providers to maintain a healthy weight. Tell a health care provider about:  Any allergies you have.  All medicines you are taking, including vitamins, herbs, eye drops, creams, and over-the-counter medicines.  Any problems you or family members have had with anesthetic medicines.  Any blood disorders you have.  Any surgeries you have had.  Any medical conditions you have.  Whether you are pregnant or may be pregnant.  Any family history of stomach cancer.  Any stomach problems you have.  Any mental health problems you have. What are the risks? Generally, this is a safe procedure. However, problems may occur, including:  Stomach pain and nausea.  Infection.  Bleeding.  Allergic reactions to medicines or dyes.  Damage to the stomach or the part of the body that moves food from the mouth to the stomach (esophagus), such as a hole (perforation).  Stomach juices leaking outside of the stomach.  A blood clot that travels to a lung (pulmonary embolism). What happens before the procedure? Medicines  Ask your health care provider about: ? Changing or stopping your regular medicines. This is especially important if you are taking diabetes medicines or blood  thinners. ? Taking medicines such as aspirin and ibuprofen. These medicines can thin your blood. Do not take these medicines unless your health care provider tells you to take them. ? Taking over-the-counter medicines, vitamins, herbs, and supplements.  You may be prescribed antibiotic medicine. If so, take the medicine as told by your health care provider. Staying hydrated Follow instructions from your health care provider about hydration, which may include:  Up to 2 hours before the procedure - you may continue to drink clear liquids, such as water, clear fruit juice, black coffee, and plain tea. Eating and drinking restrictions Follow instructions from your health care provider about eating and drinking, which may include:  8 hours before the procedure - stop eating heavy meals or foods such as meat, fried foods, or fatty foods.  6 hours before the procedure - stop eating light meals or foods, such as toast or cereal.  6 hours before the procedure - stop drinking milk or drinks that contain milk.  2 hours before the procedure - stop drinking clear liquids. General instructions  You may need to meet with a diet and nutrition specialist (dietitian) and a mental health professional to make sure you can commit to maintaining a healthy diet and a healthy weight for years after this procedure.  Plan to have someone take you home from the hospital or clinic.  Plan to have a responsible adult care for you for at least 24 hours after you leave the hospital or clinic. This is important. What happens during the procedure?  To lower your risk of infection, your health care team will wash or sanitize their   hands.  An IV will be inserted into one of your veins.  You will be given one or more of the following: ? A medicine to make you fall asleep (general anesthetic). ? Antibiotics. ? Anti-nausea medicine.  You will be positioned so that you are lying on your left side.  An endoscope will  be passed through your mouth, down your esophagus, and into your stomach.  An air-like gas will be sent through the endoscope to expand (inflate) your stomach. This will give your surgeon a clearer view inside of your stomach and will make more room to do the procedure.  Your surgeon will use staples or sutures to make your stomach narrower and shorter. These will be applied using a tool at the end of the endoscope.  Your stomach will be washed out with an antibiotic solution.  The endoscope will be removed. The procedure may vary among health care providers and hospitals. What happens after the procedure?  Your blood pressure, heart rate, breathing rate, and blood oxygen level will be monitored until the medicines you were given have worn off.  You may continue to receive fluids and medicines through an IV. When you are able to drink clear fluids, your IV will be removed.  Do not drive for 24 hours if you were given a sedative during your procedure. Summary  Endoscopic sleeve gastroplasty is a procedure to help you lose weight (bariatric procedure).  This procedure is done through a long, flexible tube (endoscope). No incisions are necessary.  During this procedure, the surgeon will use staples or stitches (sutures) to make your stomach shorter and narrower.  It will be important to work with your health care providers for years after this procedure to maintain a healthy weight through diet and exercise. This information is not intended to replace advice given to you by your health care provider. Make sure you discuss any questions you have with your health care provider. Document Released: 04/27/2017 Document Revised: 01/24/2019 Document Reviewed: 04/27/2017 Elsevier Patient Education  2020 [employer]  

## 2019-06-11 NOTE — Assessment & Plan Note (Signed)
BP is well controlled on Zestoretic, please continue.

## 2019-06-14 DIAGNOSIS — Z794 Long term (current) use of insulin: Secondary | ICD-10-CM | POA: Diagnosis not present

## 2019-06-14 DIAGNOSIS — H40033 Anatomical narrow angle, bilateral: Secondary | ICD-10-CM | POA: Diagnosis not present

## 2019-06-14 DIAGNOSIS — H04123 Dry eye syndrome of bilateral lacrimal glands: Secondary | ICD-10-CM | POA: Diagnosis not present

## 2019-06-14 DIAGNOSIS — E119 Type 2 diabetes mellitus without complications: Secondary | ICD-10-CM | POA: Diagnosis not present

## 2019-06-14 LAB — HM DIABETES EYE EXAM

## 2019-06-25 ENCOUNTER — Encounter: Payer: BC Managed Care – PPO | Attending: Internal Medicine | Admitting: *Deleted

## 2019-06-25 ENCOUNTER — Other Ambulatory Visit: Payer: Self-pay

## 2019-06-25 DIAGNOSIS — Z794 Long term (current) use of insulin: Secondary | ICD-10-CM | POA: Insufficient documentation

## 2019-06-25 DIAGNOSIS — E1169 Type 2 diabetes mellitus with other specified complication: Secondary | ICD-10-CM | POA: Diagnosis not present

## 2019-06-25 NOTE — Progress Notes (Signed)
Diabetes Self-Management Education  Visit Type: First/Initial  Appt. Start Time: 1400 Appt. End Time: 4098  06/25/2019  [name], identified by name and date of birth, is a 48 y.o. female with a diagnosis of Diabetes: Type 2. Patient initially was referred for training on Dexcom G 6 CGM but she states she received training from [facility] already and she is wearing the device. She is still interested in Diabetes Education with assistance on weight loss. She states when first diagnosed she ate very carefully and started running, eventually running several half and full marathons. She took a break about 4 years ago and has gained quite a lot of weight back since she quit running. Also significant that she works 2 jobs for a total of over 60 hours a week. She states her husband is very supportive.   ASSESSMENT  There were no vitals taken for this visit. There is no height or weight on file to calculate BMI.  Diabetes Self-Management Education - 06/25/19 1724      Visit Information   Visit Type  First/Initial      Initial Visit   Diabetes Type  Type 2    Are you currently following a meal plan?  No    Are you taking your medications as prescribed?  Yes    Date Diagnosed  05/2011      Health Coping   How would you rate your overall health?  Fair      Psychosocial Assessment   Patient Belief/Attitude about Diabetes  Denial   defeated and burned out   Self-care barriers  Other (comment)   works about 60 hours a week   Self-management support  Family;Doctor's office    Other persons present  Patient    Patient Concerns  Nutrition/Meal planning;Weight Control;Glycemic Control    Special Needs  None    Preferred Learning Style  Auditory;Visual;Hands on    Learning Readiness  Ready    How often do you need to have someone help you when you read instructions, pamphlets, or other written materials from your doctor or pharmacy?  1 - Never    What is the last grade level you completed in  school?  2 years college      Pre-Education Assessment   Patient understands the diabetes disease and treatment process.  Needs Instruction    Patient understands incorporating nutritional management into lifestyle.  Needs Instruction    Patient undertands incorporating physical activity into lifestyle.  Needs Instruction    Patient understands using medications safely.  Needs Instruction    Patient understands monitoring blood glucose, interpreting and using results  Needs Instruction    Patient understands prevention, detection, and treatment of acute complications.  Needs Instruction    Patient understands prevention, detection, and treatment of chronic complications.  Needs Instruction    Patient understands how to develop strategies to address psychosocial issues.  Needs Instruction    Patient understands how to develop strategies to promote health/change behavior.  Needs Instruction      Complications   Last HgB A1C per patient/outside source  8.3 %    How often do you check your blood sugar?  > 4 times/day   now has Dexcom G 6   Number of hypoglycemic episodes per month  6    Can you tell when your blood sugar is low?  Yes    Have you had a dilated eye exam in the past 12 months?  Yes    Have you  had a dental exam in the past 12 months?  Yes    Are you checking your feet?  Yes    How many days per week are you checking your feet?  5      Dietary Intake   Breakfast  ham and egg biscuit on way to work OR flavored oatmeal with fresh fruit    Snack (morning)  no    Lunch  lunch brought into department by reps OR chic filet with fries OR sandwich    Snack (afternoon)  no    Dinner  meat and vegetables  or large salad    Snack (evening)  no    Beverage(s)  coffee with Splenda and creamer, sweet tea sometimes 1/2 and 1/2 tea      Exercise   Exercise Type  Light (walking / raking leaves)    How many days per week to you exercise?  3    How many minutes per day do you exercise?  30     Total minutes per week of exercise  90      Individualized Goals (developed by patient)   Nutrition  Follow meal plan discussed    Physical Activity  Exercise 3-5 times per week    Medications  take my medication as prescribed    Monitoring   test blood glucose pre and post meals as discussed      Outcomes   Expected Outcomes  Demonstrated interest in learning. Expect positive outcomes    Future DMSE  4-6 wks    Program Status  Not Completed       Individualized Plan for Diabetes Self-Management Training:   Learning Objective:  Patient will have a greater understanding of diabetes self-management. Patient education plan is to attend individual and/or group sessions per assessed needs and concerns.   Plan:   Patient Instructions  Plan:  Aim for 3-4 Carb Choices per meal (45-60 grams)   Aim for 0-2 Carbs per snack if hungry  Include protein in moderation with your meals and snacks Consider reading food labels for Total Carbohydrate of foods Consider  increasing your activity level by doing walk/run intervals for 15-30 minutes 3 days a week as tolerated Continue checking BG at alternate times per day with  Dexcom G-6 Continue taking medication as directed by MD If you start having low blood sugars, let Dr. Sharl MaKerr know so he can reduce your insulin dose.  Expected Outcomes:  Demonstrated interest in learning. Expect positive outcomes  Education material provided: Food label handouts, A1C conversion sheet, Meal plan card and Carbohydrate counting sheet  If problems or questions, patient to contact team via:  Phone  Future DSME appointment: 4-6 weeks to discuss insulin options and possibly pump.

## 2019-06-25 NOTE — Patient Instructions (Signed)
Plan:  Aim for 3-4 Carb Choices per meal (45-60 grams)   Aim for 0-2 Carbs per snack if hungry  Include protein in moderation with your meals and snacks Consider reading food labels for Total Carbohydrate of foods Consider  increasing your activity level by doing walk/run intervals for 15-30 minutes 3 days a week as tolerated Continue checking BG at alternate times per day with  Dexcom G-6 Continue taking medication as directed by MD If you start having low blood sugars, let Dr. Buddy Duty know so he can reduce your insulin dose.

## 2019-07-05 ENCOUNTER — Other Ambulatory Visit: Payer: Self-pay

## 2019-07-05 ENCOUNTER — Ambulatory Visit
Admission: RE | Admit: 2019-07-05 | Discharge: 2019-07-05 | Disposition: A | Discharge status: Home / Self Care | Payer: BLUE CROSS/BLUE SHIELD | Source: Ambulatory Visit | Attending: Family Medicine | Admitting: Family Medicine

## 2019-07-05 DIAGNOSIS — Z1231 Encounter for screening mammogram for malignant neoplasm of breast: Secondary | ICD-10-CM | POA: Diagnosis not present

## 2019-08-16 DIAGNOSIS — Z79899 Other long term (current) drug therapy: Secondary | ICD-10-CM | POA: Diagnosis not present

## 2019-08-16 DIAGNOSIS — Z794 Long term (current) use of insulin: Secondary | ICD-10-CM | POA: Diagnosis not present

## 2019-08-16 DIAGNOSIS — E669 Obesity, unspecified: Secondary | ICD-10-CM | POA: Diagnosis not present

## 2019-08-16 DIAGNOSIS — E119 Type 2 diabetes mellitus without complications: Secondary | ICD-10-CM | POA: Diagnosis not present

## 2019-11-19 ENCOUNTER — Ambulatory Visit: Payer: Self-pay | Attending: Internal Medicine

## 2019-11-28 ENCOUNTER — Ambulatory Visit: Payer: BC Managed Care – PPO

## 2019-12-18 DIAGNOSIS — Z794 Long term (current) use of insulin: Secondary | ICD-10-CM | POA: Diagnosis not present

## 2019-12-18 DIAGNOSIS — E119 Type 2 diabetes mellitus without complications: Secondary | ICD-10-CM | POA: Diagnosis not present

## 2019-12-18 DIAGNOSIS — Z5181 Encounter for therapeutic drug level monitoring: Secondary | ICD-10-CM | POA: Diagnosis not present

## 2019-12-18 DIAGNOSIS — E669 Obesity, unspecified: Secondary | ICD-10-CM | POA: Diagnosis not present

## 2019-12-18 LAB — HEMOGLOBIN A1C: Hemoglobin A1C: 8.4

## 2020-03-17 DIAGNOSIS — E669 Obesity, unspecified: Secondary | ICD-10-CM | POA: Diagnosis not present

## 2020-03-17 DIAGNOSIS — I10 Essential (primary) hypertension: Secondary | ICD-10-CM | POA: Diagnosis not present

## 2020-03-17 DIAGNOSIS — Z794 Long term (current) use of insulin: Secondary | ICD-10-CM | POA: Diagnosis not present

## 2020-03-17 DIAGNOSIS — E1165 Type 2 diabetes mellitus with hyperglycemia: Secondary | ICD-10-CM | POA: Diagnosis not present

## 2020-03-20 ENCOUNTER — Encounter: Payer: Self-pay | Admitting: Family Medicine

## 2020-07-07 ENCOUNTER — Other Ambulatory Visit: Payer: Self-pay

## 2020-07-07 DIAGNOSIS — E669 Obesity, unspecified: Secondary | ICD-10-CM | POA: Diagnosis not present

## 2020-07-07 DIAGNOSIS — Z794 Long term (current) use of insulin: Secondary | ICD-10-CM | POA: Diagnosis not present

## 2020-07-07 DIAGNOSIS — I10 Essential (primary) hypertension: Secondary | ICD-10-CM | POA: Diagnosis not present

## 2020-07-07 DIAGNOSIS — E1165 Type 2 diabetes mellitus with hyperglycemia: Secondary | ICD-10-CM | POA: Diagnosis not present

## 2020-07-10 ENCOUNTER — Other Ambulatory Visit: Payer: BC Managed Care – PPO

## 2020-07-14 ENCOUNTER — Ambulatory Visit: Payer: BC Managed Care – PPO | Attending: Internal Medicine

## 2020-07-14 DIAGNOSIS — Z23 Encounter for immunization: Secondary | ICD-10-CM

## 2020-07-14 NOTE — Progress Notes (Signed)
   Covid-19 Vaccination Clinic  Name:  Christy Cunningham    MRN: [id number] DOB: 31-Dec-1970  07/14/2020  [name] was observed post Covid-19 immunization for 15 minutes without incident. She was provided with Vaccine Information Sheet and instruction to access the V-Safe system.   [name] was instructed to call 911 with any severe reactions post vaccine: Marland Kitchen Difficulty breathing  . Swelling of face and throat  . A fast heartbeat  . A bad rash all over body  . Dizziness and weakness

## 2020-09-25 ENCOUNTER — Encounter: Payer: Self-pay | Admitting: Family Medicine

## 2020-10-13 ENCOUNTER — Encounter: Payer: Self-pay | Admitting: Family Medicine

## 2020-11-04 ENCOUNTER — Other Ambulatory Visit: Payer: Self-pay

## 2020-11-05 ENCOUNTER — Other Ambulatory Visit: Payer: Self-pay | Admitting: Family Medicine

## 2020-11-05 DIAGNOSIS — Z1231 Encounter for screening mammogram for malignant neoplasm of breast: Secondary | ICD-10-CM

## 2020-12-05 ENCOUNTER — Other Ambulatory Visit: Payer: Self-pay

## 2020-12-05 ENCOUNTER — Emergency Department (HOSPITAL_BASED_OUTPATIENT_CLINIC_OR_DEPARTMENT_OTHER)
Admission: EM | Admit: 2020-12-05 | Discharge: 2020-12-06 | Disposition: A | Discharge status: Home / Self Care | Payer: 59 | Attending: Emergency Medicine | Admitting: Emergency Medicine

## 2020-12-05 ENCOUNTER — Encounter (HOSPITAL_BASED_OUTPATIENT_CLINIC_OR_DEPARTMENT_OTHER): Payer: Self-pay | Admitting: Emergency Medicine

## 2020-12-05 DIAGNOSIS — B349 Viral infection, unspecified: Secondary | ICD-10-CM | POA: Insufficient documentation

## 2020-12-05 DIAGNOSIS — Z20822 Contact with and (suspected) exposure to covid-19: Secondary | ICD-10-CM | POA: Insufficient documentation

## 2020-12-05 DIAGNOSIS — I10 Essential (primary) hypertension: Secondary | ICD-10-CM | POA: Insufficient documentation

## 2020-12-05 DIAGNOSIS — E119 Type 2 diabetes mellitus without complications: Secondary | ICD-10-CM | POA: Insufficient documentation

## 2020-12-05 DIAGNOSIS — R112 Nausea with vomiting, unspecified: Secondary | ICD-10-CM | POA: Diagnosis not present

## 2020-12-05 DIAGNOSIS — R110 Nausea: Secondary | ICD-10-CM

## 2020-12-05 LAB — COMPREHENSIVE METABOLIC PANEL
ALT: 25 U/L (ref 0–44)
AST: 21 U/L (ref 15–41)
Albumin: 4 g/dL (ref 3.5–5.0)
Alkaline Phosphatase: 81 U/L (ref 38–126)
Anion gap: 11 (ref 5–15)
BUN: 16 mg/dL (ref 6–20)
CO2: 26 mmol/L (ref 22–32)
Calcium: 9 mg/dL (ref 8.9–10.3)
Chloride: 98 mmol/L (ref 98–111)
Creatinine, Ser: 0.88 mg/dL (ref 0.44–1.00)
GFR, Estimated: >60 mL/min (ref >60)
Glucose, Bld: 264 mg/dL — ABNORMAL HIGH (ref 70–99)
Potassium: 4.1 mmol/L (ref 3.5–5.1)
Sodium: 135 mmol/L (ref 135–145)
Total Bilirubin: 0.7 mg/dL (ref 0.3–1.2)
Total Protein: 8 g/dL (ref 6.5–8.1)

## 2020-12-05 LAB — CBC
HCT: 46.9 % — ABNORMAL HIGH (ref 36.0–46.0)
Hemoglobin: 16.1 g/dL — ABNORMAL HIGH (ref 12.0–15.0)
MCH: 32.9 pg (ref 26.0–34.0)
MCHC: 34.3 g/dL (ref 30.0–36.0)
MCV: 95.7 fL (ref 80.0–100.0)
Platelets: 415 10*3/uL — ABNORMAL HIGH (ref 150–400)
RBC: 4.9 MIL/uL (ref 3.87–5.11)
RDW: 11.7 % (ref 11.5–15.5)
WBC: 11.1 10*3/uL — ABNORMAL HIGH (ref 4.0–10.5)
nRBC: 0 % (ref 0.0–0.2)

## 2020-12-05 LAB — LIPASE, BLOOD: Lipase: 25 U/L (ref 11–51)

## 2020-12-05 MED ORDER — ONDANSETRON HCL 4 MG/2ML IJ SOLN
4.0000 mg | Freq: Once | INTRAMUSCULAR | Status: AC | PRN
  Administered 2020-12-05: 4 mg via INTRAVENOUS
  Filled 2020-12-05: qty 2

## 2020-12-05 MED ORDER — SODIUM CHLORIDE 0.9 % IV BOLUS
1000.0000 mL | Freq: Once | INTRAVENOUS | Status: AC
  Administered 2020-12-05: 1000 mL via INTRAVENOUS

## 2020-12-05 NOTE — ED Triage Notes (Signed)
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  Has had n/v/d since 1030 this morning.  Also reports body aches, chills, fatigue,  Sweating,  And sore throat.

## 2020-12-05 NOTE — ED Notes (Signed)
EDP at bedside  

## 2020-12-05 NOTE — ED Provider Notes (Signed)
[facility]ER[facility]ENC[facility]PT [facility] Emergency Department Provider Note MRN:  [id number]  Arrival date & time: 12/06/20     Chief Complaint   Covid symptoms   History of Present Illness   Christy Cunningham is a 50 y.o. year-old female with a history of hypertension, diabetes presenting to the ED with chief complaint of vomiting.  Patient is endorsing 1 day of nausea, vomiting, diarrhea, body aches, sore throat.  Denies chest pain or shortness of breath, no abdominal pain.  Denies cough.  Fever and chills endorsed, subjective.  Symptoms are mild to moderate, constant, no exacerbating relieving factors.  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  Review of Systems  A complete 10 system review of systems was obtained and all systems are negative except as noted in the HPI and PMH.   Patient's Health History    Past Medical History:  Diagnosis Date  . Diabetes mellitus   . Hypertension     Past Surgical History:  Procedure Laterality Date  . ABDOMINAL HYSTERECTOMY      Family History  Problem Relation Age of Onset  . Diabetes Sister   . Healthy Mother   . Healthy Father   . Breast cancer Neg Hx     Social History   Socioeconomic History  . Marital status: Single    Spouse name: Not on file  . Number of children: Not on file  . Years of education: Not on file  . Highest education level: Not on file  Occupational History  . Not on file  Tobacco Use  . Smoking status: Never Smoker  . Smokeless tobacco: Never Used  Substance and Sexual Activity  . Alcohol use: No  . Drug use: No  . Sexual activity: Not on file  Other Topics Concern  . Not on file  Social History Narrative   ** Merged History Encounter **       Social Determinants of Health   Financial Resource Strain: Not on file  Food Insecurity: Not on file  Transportation Needs: Not on file  Physical Activity: Not on file  Stress: Not on file  Social Connections: Not on file  Intimate Partner Violence:  Not on file     Physical Exam   Vitals:   12/05/20 2330 12/06/20 0000  BP: 134/78 (!) 141/73  Pulse: (!) 108 (!) 105  Resp: 20 20  Temp:    SpO2: 97% 100%    CONSTITUTIONAL: Well-appearing, NAD NEURO:  Alert and oriented x 3, no focal deficits EYES:  eyes equal and reactive ENT/NECK:  no LAD, no JVD CARDIO: Tachycardic rate, well-perfused, normal S1 and S2 PULM:  CTAB no wheezing or rhonchi GI/GU:  normal bowel sounds, non-distended, non-tender MSK/SPINE:  No gross deformities, no edema SKIN:  no rash, atraumatic PSYCH:  Appropriate speech and behavior  *Additional and/or pertinent findings included in MDM below  Diagnostic and Interventional Summary    EKG Interpretation  Date/Time:    Ventricular Rate:    PR Interval:    QRS Duration:   QT Interval:    QTC Calculation:   R Axis:     Text Interpretation:        Labs Reviewed  COMPREHENSIVE METABOLIC PANEL - Abnormal; Notable for the following components:      Result Value   Glucose, Bld 264 (*)    All other components within normal limits  CBC - Abnormal; Notable for the following components:   WBC 11.1 (*)    Hemoglobin 16.1 (*)  HCT 46.9 (*)    Platelets 415 (*)    All other components within normal limits  SARS CORONAVIRUS 2 (TAT 6-24 HRS)  LIPASE, BLOOD    No orders to display    Medications  ondansetron (ZOFRAN) injection 4 mg (4 mg Intravenous Given 12/05/20 2030)  sodium chloride 0.9 % bolus 1,000 mL (1,000 mLs Intravenous New Bag/Given 12/05/20 2342)     Procedures  /  Critical Care Procedures  ED Course and Medical Decision Making  I have reviewed the triage vital signs, the nursing notes, and pertinent available records from the EMR.  Listed above are laboratory and imaging tests that I personally ordered, reviewed, and interpreted and then considered in my medical decision making (see below for details).  Symptoms suspicious for COVID-19 or other viral illness.  Well-appearing, mildly  tachycardic, no increased work of breathing, benign abdomen, doubt significant intra-abdominal process.  Providing IV fluids and will reassess.  Suspect will be able to discharge with symptomatic control at home.  Christy Cunningham was evaluated in Emergency Department on 12/06/2020 for the symptoms described in the history of present illness. She was evaluated in the context of the global COVID-19 pandemic, which necessitated consideration that the patient might be at risk for infection with the SARS-CoV-2 virus that causes COVID-19. Institutional protocols and algorithms that pertain to the evaluation of patients at risk for COVID-19 are in a state of rapid change based on information released by regulatory bodies including the CDC and federal and state organizations. These policies and algorithms were followed during the patient's care in the ED.      Heart rate improved after fluids, feeling better, appropriate for discharge.  Elmer Sow. Pilar Plate, MD [facility] Emergency Medicine [facility] [email or web address]  Final Clinical Impressions(s) / ED Diagnoses     ICD-10-CM   1. Viral illness  B34.9   2. Nausea  R11.0     ED Discharge Orders         Ordered    ondansetron (ZOFRAN ODT) 4 MG disintegrating tablet  Every 8 hours PRN        12/06/20 0044           Discharge Instructions Discussed with and Provided to Patient:     Discharge Instructions     You were evaluated in the Emergency Department and after careful evaluation, we did not find any emergent condition requiring admission or further testing in the hospital.  Your exam/testing today was overall reassuring.  Your symptoms seem to be due to a viral illness, possibly COVID-19.  We have tested you for the coronavirus here in the Emergency Department.  Please isolate or quarantine at home until you receive a negative test result.  If positive, we recommend continued home quarantine per [facility]  recommendations.   Please return to the Emergency Department if you experience any worsening of your condition.  Thank you for allowing [place] to be a part of your care.        Sabas Sous, MD 12/06/20 [phone]

## 2020-12-06 LAB — SARS CORONAVIRUS 2 (TAT 6-24 HRS): SARS Coronavirus 2: NEGATIVE

## 2020-12-06 MED ORDER — ONDANSETRON 4 MG PO TBDP: 4.0000 mg | ORAL_TABLET | Freq: Three times a day (TID) | ORAL | 0 refills | Status: AC | PRN

## 2020-12-06 NOTE — Discharge Instructions (Addendum)
You were evaluated in the Emergency Department and after careful evaluation, we did not find any emergent condition requiring admission or further testing in the hospital.  Your exam/testing today was overall reassuring.  Your symptoms seem to be due to a viral illness, possibly COVID-19.  We have tested you for the coronavirus here in the Emergency Department.  Please isolate or quarantine at home until you receive a negative test result.  If positive, we recommend continued home quarantine per [facility] recommendations.   Please return to the Emergency Department if you experience any worsening of your condition.  Thank you for allowing [place] to be a part of your care.

## 2020-12-06 NOTE — ED Notes (Signed)
Pt tolerated PO.

## 2020-12-18 ENCOUNTER — Other Ambulatory Visit: Payer: Self-pay

## 2020-12-18 ENCOUNTER — Ambulatory Visit
Admission: RE | Admit: 2020-12-18 | Discharge: 2020-12-18 | Disposition: A | Discharge status: Home / Self Care | Payer: 59 | Source: Ambulatory Visit | Attending: Family Medicine | Admitting: Family Medicine

## 2020-12-18 DIAGNOSIS — Z1231 Encounter for screening mammogram for malignant neoplasm of breast: Secondary | ICD-10-CM

## 2021-01-07 DIAGNOSIS — Z794 Long term (current) use of insulin: Secondary | ICD-10-CM | POA: Diagnosis not present

## 2021-01-07 DIAGNOSIS — E669 Obesity, unspecified: Secondary | ICD-10-CM | POA: Diagnosis not present

## 2021-01-07 DIAGNOSIS — I10 Essential (primary) hypertension: Secondary | ICD-10-CM | POA: Diagnosis not present

## 2021-01-07 DIAGNOSIS — E1165 Type 2 diabetes mellitus with hyperglycemia: Secondary | ICD-10-CM | POA: Diagnosis not present

## 2021-04-09 DIAGNOSIS — Z794 Long term (current) use of insulin: Secondary | ICD-10-CM | POA: Diagnosis not present

## 2021-04-09 DIAGNOSIS — I10 Essential (primary) hypertension: Secondary | ICD-10-CM | POA: Diagnosis not present

## 2021-04-09 DIAGNOSIS — E669 Obesity, unspecified: Secondary | ICD-10-CM | POA: Diagnosis not present

## 2021-04-09 DIAGNOSIS — E1165 Type 2 diabetes mellitus with hyperglycemia: Secondary | ICD-10-CM | POA: Diagnosis not present

## 2021-06-28 DIAGNOSIS — Z113 Encounter for screening for infections with a predominantly sexual mode of transmission: Secondary | ICD-10-CM | POA: Diagnosis not present

## 2021-06-28 DIAGNOSIS — Z01419 Encounter for gynecological examination (general) (routine) without abnormal findings: Secondary | ICD-10-CM | POA: Diagnosis not present

## 2021-06-28 DIAGNOSIS — N760 Acute vaginitis: Secondary | ICD-10-CM | POA: Diagnosis not present

## 2021-06-28 DIAGNOSIS — Z6841 Body Mass Index (BMI) 40.0 and over, adult: Secondary | ICD-10-CM | POA: Diagnosis not present

## 2021-08-24 DIAGNOSIS — E1165 Type 2 diabetes mellitus with hyperglycemia: Secondary | ICD-10-CM | POA: Diagnosis not present

## 2021-08-24 DIAGNOSIS — Z794 Long term (current) use of insulin: Secondary | ICD-10-CM | POA: Diagnosis not present

## 2021-08-24 DIAGNOSIS — E669 Obesity, unspecified: Secondary | ICD-10-CM | POA: Diagnosis not present

## 2021-08-24 DIAGNOSIS — I10 Essential (primary) hypertension: Secondary | ICD-10-CM | POA: Diagnosis not present

## 2021-11-23 ENCOUNTER — Other Ambulatory Visit: Payer: Self-pay | Admitting: Family Medicine

## 2021-11-23 DIAGNOSIS — Z1231 Encounter for screening mammogram for malignant neoplasm of breast: Secondary | ICD-10-CM

## 2021-12-09 ENCOUNTER — Other Ambulatory Visit: Payer: Self-pay

## 2021-12-09 ENCOUNTER — Ambulatory Visit (INDEPENDENT_AMBULATORY_CARE_PROVIDER_SITE_OTHER): Payer: 59 | Admitting: Family Medicine

## 2021-12-09 VITALS — BP 114/81 | HR 83 | Wt 256.0 lb

## 2021-12-09 DIAGNOSIS — Z0000 Encounter for general adult medical examination without abnormal findings: Secondary | ICD-10-CM | POA: Diagnosis not present

## 2021-12-09 DIAGNOSIS — K219 Gastro-esophageal reflux disease without esophagitis: Secondary | ICD-10-CM | POA: Diagnosis not present

## 2021-12-09 DIAGNOSIS — I10 Essential (primary) hypertension: Secondary | ICD-10-CM | POA: Diagnosis not present

## 2021-12-09 DIAGNOSIS — Z1159 Encounter for screening for other viral diseases: Secondary | ICD-10-CM

## 2021-12-09 DIAGNOSIS — Z23 Encounter for immunization: Secondary | ICD-10-CM

## 2021-12-09 DIAGNOSIS — Z794 Long term (current) use of insulin: Secondary | ICD-10-CM | POA: Diagnosis not present

## 2021-12-09 DIAGNOSIS — Z1211 Encounter for screening for malignant neoplasm of colon: Secondary | ICD-10-CM | POA: Diagnosis not present

## 2021-12-09 DIAGNOSIS — K5904 Chronic idiopathic constipation: Secondary | ICD-10-CM | POA: Diagnosis not present

## 2021-12-09 DIAGNOSIS — E119 Type 2 diabetes mellitus without complications: Secondary | ICD-10-CM | POA: Diagnosis not present

## 2021-12-09 DIAGNOSIS — E669 Obesity, unspecified: Secondary | ICD-10-CM | POA: Diagnosis not present

## 2021-12-09 DIAGNOSIS — E1165 Type 2 diabetes mellitus with hyperglycemia: Secondary | ICD-10-CM | POA: Diagnosis not present

## 2021-12-09 DIAGNOSIS — R635 Abnormal weight gain: Secondary | ICD-10-CM | POA: Diagnosis not present

## 2021-12-09 LAB — POCT GLYCOSYLATED HEMOGLOBIN (HGB A1C): HbA1c, POC (controlled diabetic range): 7 % (ref 0.0–7.0)

## 2021-12-09 MED ORDER — ZOSTER VAC RECOMB ADJUVANTED 50 MCG/0.5ML IM SUSR: 0.5000 mL | Freq: Once | INTRAMUSCULAR | 0 refills | Status: AC

## 2021-12-09 NOTE — Patient Instructions (Signed)

## 2021-12-09 NOTE — Progress Notes (Signed)
° ° °  SUBJECTIVE:   Chief compliant/HPI: annual examination  Christy Cunningham is a 51 y.o. who presents today for an annual exam.   Concerns: -Re-establishing care. Has not been seen in several years. Goes regularly to endocrinologist for her diabetes. Dr. Sharl Ma. Was last seen in November and would like to check her A1c today. Recently switched to ozempic from [facility]. Taking 80 units of tresiba and 1000 mg of metformin daily.   OBJECTIVE:   BP 114/81    Pulse 83    Wt 256 lb (116.1 kg)    SpO2 100%    BMI 40.10 kg/m   PHQ9 SCORE ONLY 12/09/2021 06/25/2019 04/18/2018  PHQ-9 Total Score 4 0 5  Physical Exam Vitals reviewed.  Constitutional:      General: She is not in acute distress.    Appearance: She is not ill-appearing.  Cardiovascular:     Rate and Rhythm: Normal rate and regular rhythm.     Heart sounds: Normal heart sounds.  Pulmonary:     Effort: Pulmonary effort is normal.     Breath sounds: Normal breath sounds.  Abdominal:     Palpations: Abdomen is soft. There is no mass.     Tenderness: There is no abdominal tenderness. There is no guarding or rebound.  Musculoskeletal:     Cervical back: Neck supple.  Lymphadenopathy:     Cervical: No cervical adenopathy.  Neurological:     Mental Status: She is alert and oriented to person, place, and time.  Psychiatric:        Mood and Affect: Mood normal.        Behavior: Behavior normal.    ASSESSMENT/PLAN:   No problem-specific Assessment & Plan notes found for this encounter. 1. Essential hypertension Well controlled. Continue current medications.   2. Diabetes mellitus without complication (HCC) A1c 7.0 today. Continue to follow up with endocrinology.  - Lipid Panel - HgB A1c  Annual Examination  See AVS for age appropriate recommendations  PHQ score 4, reviewed and discussed.  BP reviewed and at goal.  Asked about intimate partner violence and resources given as appropriate  Patient has an Advance directive.    Considered the following items based upon USPSTF recommendations: Diabetes screening: ordered Screening for elevated cholesterol: ordered HIV testing: ordered Hepatitis C: ordered Hepatitis B:  not indicated Syphilis if at high risk:  not indicated GC/CT not at high risk and not ordered. Osteoporosis screening considered based upon risk of fracture from [facility] calculator. Major osteoporotic fracture risk is 2.5%. DEXA not ordered.  Reviewed risk factors for latent tuberculosis and not indicated  Cervical cancer screening: not indicated given history of hysterectomy with prior normal cytology.  Breast cancer screening:  scheduled for 12/20/2021 Colorectal cancer screening:  GI appt today @ 415pm Lung cancer screening:  not indicated .   Vaccinations: - Zoster Vaccine Adjuvanted [facility]) injection; Inject 0.5 mLs into the muscle once for 1 dose.  Dispense: 0.5 mL; Refill: 0 - Pneumococcal conjugate vaccine 20-valent (Prevnar 20).   Follow up in 1 year or sooner if indicated.    Lavonda Jumbo, DO [facility]

## 2021-12-10 LAB — LIPID PANEL
Chol/HDL Ratio: 2.5 ratio (ref 0.0–4.4)
Cholesterol, Total: 112 mg/dL (ref 100–199)
HDL: 45 mg/dL (ref >39)
LDL Chol Calc (NIH): 56 mg/dL (ref 0–99)
Triglycerides: 45 mg/dL (ref 0–149)
VLDL Cholesterol Cal: 11 mg/dL (ref 5–40)

## 2021-12-10 LAB — HIV ANTIBODY (ROUTINE TESTING W REFLEX): HIV Screen 4th Generation wRfx: NONREACTIVE

## 2021-12-10 LAB — HCV INTERPRETATION

## 2021-12-10 LAB — HCV AB W REFLEX TO QUANT PCR: HCV Ab: NONREACTIVE

## 2021-12-20 ENCOUNTER — Ambulatory Visit
Admission: RE | Admit: 2021-12-20 | Discharge: 2021-12-20 | Disposition: A | Discharge status: Home / Self Care | Payer: 59 | Source: Ambulatory Visit | Attending: Family Medicine | Admitting: Family Medicine

## 2021-12-20 DIAGNOSIS — Z1231 Encounter for screening mammogram for malignant neoplasm of breast: Secondary | ICD-10-CM

## 2021-12-23 ENCOUNTER — Encounter: Payer: Self-pay | Admitting: Family Medicine

## 2021-12-23 DIAGNOSIS — Z90710 Acquired absence of both cervix and uterus: Secondary | ICD-10-CM | POA: Insufficient documentation

## 2022-03-15 DIAGNOSIS — E669 Obesity, unspecified: Secondary | ICD-10-CM | POA: Diagnosis not present

## 2022-03-15 DIAGNOSIS — I10 Essential (primary) hypertension: Secondary | ICD-10-CM | POA: Diagnosis not present

## 2022-03-15 DIAGNOSIS — Z794 Long term (current) use of insulin: Secondary | ICD-10-CM | POA: Diagnosis not present

## 2022-03-15 DIAGNOSIS — E1165 Type 2 diabetes mellitus with hyperglycemia: Secondary | ICD-10-CM | POA: Diagnosis not present

## 2022-03-22 ENCOUNTER — Encounter: Payer: Self-pay | Admitting: *Deleted

## 2022-04-25 DIAGNOSIS — J329 Chronic sinusitis, unspecified: Secondary | ICD-10-CM | POA: Diagnosis not present

## 2022-04-25 DIAGNOSIS — J069 Acute upper respiratory infection, unspecified: Secondary | ICD-10-CM | POA: Diagnosis not present

## 2022-04-25 DIAGNOSIS — R059 Cough, unspecified: Secondary | ICD-10-CM | POA: Diagnosis not present

## 2022-06-22 ENCOUNTER — Ambulatory Visit (INDEPENDENT_AMBULATORY_CARE_PROVIDER_SITE_OTHER): Payer: 59 | Admitting: Radiology

## 2022-06-22 ENCOUNTER — Encounter: Payer: Self-pay | Admitting: Radiology

## 2022-06-22 ENCOUNTER — Other Ambulatory Visit (HOSPITAL_COMMUNITY)
Admission: RE | Admit: 2022-06-22 | Discharge: 2022-06-22 | Disposition: A | Discharge status: Home / Self Care | Payer: 59 | Source: Ambulatory Visit | Attending: Radiology | Admitting: Radiology

## 2022-06-22 VITALS — BP 122/86 | Ht 66.0 in | Wt 246.0 lb

## 2022-06-22 DIAGNOSIS — N958 Other specified menopausal and perimenopausal disorders: Secondary | ICD-10-CM | POA: Diagnosis not present

## 2022-06-22 DIAGNOSIS — Z01419 Encounter for gynecological examination (general) (routine) without abnormal findings: Secondary | ICD-10-CM | POA: Diagnosis not present

## 2022-06-22 DIAGNOSIS — Z113 Encounter for screening for infections with a predominantly sexual mode of transmission: Secondary | ICD-10-CM

## 2022-06-22 DIAGNOSIS — B3731 Acute candidiasis of vulva and vagina: Secondary | ICD-10-CM | POA: Diagnosis not present

## 2022-06-22 MED ORDER — FLUCONAZOLE 150 MG PO TABS: 150.0000 mg | ORAL_TABLET | ORAL | 3 refills | Status: DC

## 2022-06-22 MED ORDER — ESTRADIOL 10 MCG VA TABS: 1.0000 | ORAL_TABLET | VAGINAL | 4 refills | Status: AC

## 2022-06-22 NOTE — Progress Notes (Signed)
Christy Cunningham [birth year] [id number]   History:  51 y.o. G2P0 presents for annual exam. Requests refill on diflucan. Looking for new PCP. Would like STI screen.   Gynecologic History No LMP recorded. Patient has had a hysterectomy.   Contraception/Family planning: status post hysterectomy Sexually active: yes, c/o vaginal dryness, recurrent yeast infections Last Pap: ?2019. Results were: normal, hx of abnormal and cryo Last mammogram: 12/20/21. Results were: normal  Obstetric History OB History  Gravida Para Term Preterm AB Living  2 0       0  SAB IAB Ectopic Multiple Live Births          0    # Outcome Date GA Lbr Len/2nd Weight Sex Delivery Anes PTL Lv  2 Gravida           1 Gravida              The following portions of the patient's history were reviewed and updated as appropriate: allergies, current medications, past family history, past medical history, past social history, past surgical history, and problem list.  Review of Systems Pertinent items noted in HPI and remainder of comprehensive ROS otherwise negative.   Past medical history, past surgical history, family history and social history were all reviewed and documented in the EPIC chart.   Exam:  Vitals:   06/22/22 0810  BP: 122/86  Weight: 246 lb (111.6 kg)  Height: 5\' 6"  (1.676 m)   Body mass index is 39.71 kg/m.  General appearance:  Normal Thyroid:  Symmetrical, normal in size, without palpable masses or nodularity. Respiratory  Auscultation:  Clear without wheezing or rhonchi Cardiovascular  Auscultation:  Regular rate, without rubs, murmurs or gallops  Edema/varicosities:  Not grossly evident Abdominal  Soft,nontender, without masses, guarding or rebound.  Liver/spleen:  No organomegaly noted  Hernia:  None appreciated  Skin  Inspection:  Grossly normal Breasts: Examined lying and sitting.   Right: Without masses, retractions, nipple discharge or axillary adenopathy.   Left: Without  masses, retractions, nipple discharge or axillary adenopathy. Genitourinary   Inguinal/mons:  Normal without inguinal adenopathy  External genitalia:  Normal appearing vulva with no masses, tenderness, or lesions  BUS/Urethra/Skene's glands:  Normal without masses or exudate  Vagina:  Slightly atrophic   Cervix:  absent  Uterus:  absent  Adnexa/parametria:     Rt: Normal in size, without masses or tenderness.   Lt: Normal in size, without masses or tenderness.  Anus and perineum: Normal   Patient informed chaperone available to be present for breast and pelvic exam. Patient has requested no chaperone to be present. Patient has been advised what will be completed during breast and pelvic exam.   Assessment/Plan:   1. Well woman exam with routine gynecological exam - Cytology - PAP( [place])  2. Screen for sexually transmitted diseases - HIV antibody (with reflex) - RPR - Hepatitis C antibody  3. Genitourinary syndrome of menopause - Estradiol (YUVAFEM) 10 MCG TABS vaginal tablet; Place 1 tablet (10 mcg total) vaginally 2 (two) times a week.  Dispense: 24 tablet; Refill: 4  4. Yeast vaginitis Requests refills on diflucan to use prn, chronic yeast vaginitis r/t DM - fluconazole (DIFLUCAN) 150 MG tablet; Take 1 tablet (150 mg total) by mouth every 3 (three) days.  Dispense: 2 tablet; Refill: 3    Discussed SBE, colonoscopy and DEXA screening as directed/appropriate. Recommend of exercise weekly, including weight bearing exercise. Encouraged the use of seatbelts and sunscreen. Return in  1 year for annual or as needed.   Arlie Solomons B WHNP-BC 8:51 AM 06/22/2022

## 2022-06-23 LAB — HEPATITIS C ANTIBODY: Hepatitis C Ab: NONREACTIVE

## 2022-06-23 LAB — RPR: RPR Ser Ql: NONREACTIVE

## 2022-06-23 LAB — HIV ANTIBODY (ROUTINE TESTING W REFLEX): HIV 1&2 Ab, 4th Generation: NONREACTIVE

## 2022-06-24 LAB — CYTOLOGY - PAP
Chlamydia: NEGATIVE
Comment: NEGATIVE
Comment: NEGATIVE
Comment: NEGATIVE
Comment: NORMAL
Diagnosis: NEGATIVE
High risk HPV: NEGATIVE
Neisseria Gonorrhea: NEGATIVE
Trichomonas: NEGATIVE

## 2022-09-27 ENCOUNTER — Ambulatory Visit
Admission: EM | Admit: 2022-09-27 | Discharge: 2022-09-27 | Disposition: A | Discharge status: Home / Self Care | Payer: 59 | Attending: Emergency Medicine | Admitting: Emergency Medicine

## 2022-09-27 DIAGNOSIS — R509 Fever, unspecified: Secondary | ICD-10-CM | POA: Insufficient documentation

## 2022-09-27 DIAGNOSIS — Z1152 Encounter for screening for COVID-19: Secondary | ICD-10-CM | POA: Diagnosis not present

## 2022-09-27 DIAGNOSIS — B349 Viral infection, unspecified: Secondary | ICD-10-CM | POA: Diagnosis present

## 2022-09-27 LAB — RESP PANEL BY RT-PCR (FLU A&B, COVID) ARPGX2
Influenza A by PCR: POSITIVE — AB
Influenza B by PCR: NEGATIVE
SARS Coronavirus 2 by RT PCR: NEGATIVE

## 2022-09-27 NOTE — ED Provider Notes (Signed)
Christy Cunningham    CSN: [id number] Arrival date & time: 09/27/22  1352      History   Chief Complaint Chief Complaint  Patient presents with   Headache    HPI Christy Cunningham is a 51 y.o. female.  Patient presents with 2-day history of chills, headache, postnasal drip, congestion, cough.  Treatment at home with Mucinex and Alka-Seltzer plus.  No fever, sore throat, shortness of breath, vomiting, diarrhea, or other symptoms.  Her medical history includes hypertension, diabetes, migraine headaches, anxiety, depression, obesity.   The history is provided by the patient and medical records.    Past Medical History:  Diagnosis Date   Diabetes mellitus    Hypertension     Patient Active Problem List   Diagnosis Date Noted   Absence of cervix, acquired 12/23/2021   Anxiety 06/11/2019   Essential hypertension 07/26/2017   Moderate episode of recurrent major depressive disorder ([place]) 07/26/2017   Obesity 09/02/2015   Vitamin D deficiency 10/22/2014   Migraines 04/01/2014   Diabetes mellitus ([place]) 05/01/2012   GERD (gastroesophageal reflux disease) 05/01/2012    Past Surgical History:  Procedure Laterality Date   ABDOMINAL HYSTERECTOMY     TOTAL ABDOMINAL HYSTERECTOMY      OB History     Gravida  2   Para  0   Term      Preterm      AB      Living  0      SAB      IAB      Ectopic      Multiple      Live Births  0            Home Medications    Prior to Admission medications   Medication Sig Start Date End Date Taking? Authorizing Provider  atorvastatin (LIPITOR) 40 MG tablet 1 tablet 11/22/16   [provider]  BD PEN NEEDLE NANO U/F 32G X 4 MM MISC 2 (two) times daily. as directed 03/21/19   [provider]  buPROPion (WELLBUTRIN XL) 150 MG 24 hr tablet Take 1 tablet (150 mg total) by mouth daily. 04/18/18   Donnamae Jude, MD  busPIRone (BUSPAR) 7.5 MG tablet Take 1 tablet (7.5 mg total) by mouth 2 (two) times daily.  06/11/19   Donnamae Jude, MD  cetirizine (ZYRTEC) 10 MG tablet Take 1 tablet (10 mg total) by mouth daily. 12/07/15   Jaynee Eagles, PA-C  Continuous Blood Gluc Receiver ([place]) [place] See admin instructions. Patient not taking: Reported on 12/09/2021 04/23/19   [provider]  Continuous Blood Gluc Sensor (DEXCOM G6 SENSOR) MISC CHANGE SENSOR EVERY 10 DAYS Patient not taking: Reported on 12/09/2021 04/24/19   [provider]  Continuous Blood Gluc Transmit (DEXCOM G6 TRANSMITTER) MISC See admin instructions. Patient not taking: Reported on 12/09/2021 04/24/19   [provider]  Estradiol (YUVAFEM) 10 MCG TABS vaginal tablet Place 1 tablet (10 mcg total) vaginally 2 (two) times a week. 06/23/22   Chrzanowski, Jami B, NP  fluconazole (DIFLUCAN) 150 MG tablet Take 1 tablet (150 mg total) by mouth every 3 (three) days. 06/22/22   Chrzanowski, Jami B, NP  fluticasone (FLONASE) 50 MCG/ACT nasal spray Place 2 sprays into both nostrils daily. 05/20/16   Archie Patten, MD  Glucagon, rDNA, (GLUCAGON EMERGENCY) 1 MG KIT See admin instructions. 09/14/20   [provider]  HYDROcodone-acetaminophen (NORCO/VICODIN) 5-325 MG tablet Take 1-2 tablets by mouth  every 4 (four) hours as needed. Patient not taking: Reported on 12/09/2021 09/11/18   Raylene Everts, MD  lisinopril-hydrochlorothiazide (ZESTORETIC) 10-12.5 MG tablet  05/02/19   [provider]  metFORMIN (GLUCOPHAGE-XR) 500 MG 24 hr tablet 2 tablets with evening meal 11/17/16   [provider]  omeprazole (PRILOSEC OTC) 20 MG tablet Take 1 tablet (20 mg total) by mouth daily. 04/18/18   Donnamae Jude, MD  ondansetron (ZOFRAN ODT) 4 MG disintegrating tablet Take 1 tablet (4 mg total) by mouth every 8 (eight) hours as needed for nausea or vomiting. Patient not taking: Reported on 12/09/2021 12/06/20   Maudie Flakes, MD  pantoprazole (PROTONIX) 40 MG tablet 1 tablet Patient not taking: Reported on 12/09/2021  09/30/11   [provider]  Semaglutide,0.25 or 0.5MG/DOS, (OZEMPIC, 0.25 OR 0.5 MG/DOSE,) 2 MG/1.5ML SOPN Inject 0.25 mg into the skin once a week. 12/06/21   [provider]  TRESIBA FLEXTOUCH 200 UNIT/ML [facility]  12/12/17   [provider]  Vitamin D, Ergocalciferol, (DRISDOL) 1.25 MG (50000 UNIT) CAPS capsule Take 50,000 Units by mouth once a week. 10/29/20   [provider]    Family History Family History  Problem Relation Age of Onset   Healthy Mother        deceased Jun 01, 2022 MVA   Healthy Father    Diabetes Sister    Breast cancer Neg Hx     Social History Social History   Tobacco Use   Smoking status: Never   Smokeless tobacco: Never  Substance Use Topics   Alcohol use: Yes    Comment: socially   Drug use: No     Allergies   Patient has no known allergies.   Review of Systems Review of Systems  Constitutional:  Positive for chills. Negative for fever.  HENT:  Positive for congestion, postnasal drip and rhinorrhea. Negative for ear pain and sore throat.   Respiratory:  Positive for cough. Negative for shortness of breath.   Cardiovascular:  Negative for chest pain and palpitations.  Gastrointestinal:  Negative for diarrhea and vomiting.  Skin:  Negative for color change and rash.  All other systems reviewed and are negative.    Physical Exam Triage Vital Signs ED Triage Vitals  Enc Vitals Group     BP      Pulse      Resp      Temp      Temp src      SpO2      Weight      Height      Head Circumference      Peak Flow      Pain Score      Pain Loc      Pain Edu?      Excl. in [place]?    No data found.  Updated Vital Signs BP (!) 135/92   Pulse 98   Temp 100.2 F (37.9 C)   Resp 16   SpO2 97%   Visual Acuity Right Eye Distance:   Left Eye Distance:   Bilateral Distance:    Right Eye Near:   Left Eye Near:    Bilateral Near:     Physical Exam Vitals and nursing note reviewed.  Constitutional:       General: She is not in acute distress.    Appearance: She is well-developed. She is ill-appearing.  HENT:     Right Ear: Tympanic membrane normal.     Left Ear: Tympanic  membrane normal.     Nose: Rhinorrhea present.     Mouth/Throat:     Mouth: Mucous membranes are moist.     Pharynx: Oropharynx is clear.  Cardiovascular:     Rate and Rhythm: Normal rate and regular rhythm.     Heart sounds: Normal heart sounds.  Pulmonary:     Effort: Pulmonary effort is normal. No respiratory distress.     Breath sounds: Normal breath sounds.  Musculoskeletal:     Cervical back: Neck supple.  Skin:    General: Skin is warm and dry.  Neurological:     Mental Status: She is alert.  Psychiatric:        Mood and Affect: Mood normal.        Behavior: Behavior normal.      UC Treatments / Results  Labs (all labs ordered are listed, but only abnormal results are displayed) Labs Reviewed - No data to display  EKG   Radiology No results found.  Procedures Procedures (including critical care time)  Medications Ordered in UC Medications - No data to display  Initial Impression / Assessment and Plan / UC Course  I have reviewed the triage vital signs and the nursing notes.  Pertinent labs & imaging results that were available during my care of the patient were reviewed by me and considered in my medical decision making (see chart for details).   Viral illness.  Cautioned patient to avoid OTC medications that may elevate her blood pressure.  COVID and Flu pending.  Discussed symptomatic treatment including Tylenol or ibuprofen, rest, hydration.  Instructed patient to follow up with PCP if symptoms are not improving.  She agrees to plan of care.   Final Clinical Impressions(s) / UC Diagnoses   Final diagnoses:  Viral illness     Discharge Instructions      Your COVID and Flu tests are pending.    Take Tylenol or ibuprofen as needed for fever or discomfort.  Rest and keep yourself  hydrated.    Follow-up with your primary care provider if your symptoms are not improving.         ED Prescriptions   None    PDMP not reviewed this encounter.   Sharion Balloon, NP 09/27/22 [phone]

## 2022-09-27 NOTE — Discharge Instructions (Addendum)
Your COVID and Flu tests are pending.    Take Tylenol or ibuprofen as needed for fever or discomfort.  Rest and keep yourself hydrated.    Follow-up with your primary care provider if your symptoms are not improving.     

## 2022-09-27 NOTE — ED Triage Notes (Signed)
Patient to Urgent Care with complaints of headache, post nasal drip, cough, and chills x2 days.   Reports she has been taking mucinex, alka seltzer plus

## 2022-09-28 ENCOUNTER — Telehealth (HOSPITAL_COMMUNITY): Payer: Self-pay | Admitting: Emergency Medicine

## 2022-09-28 MED ORDER — BENZONATATE 100 MG PO CAPS
100.0000 mg | ORAL_CAPSULE | Freq: Three times a day (TID) | ORAL | 0 refills | Status: AC
Start: 2022-09-28 — End: ?

## 2022-10-27 ENCOUNTER — Encounter: Payer: Self-pay | Admitting: Internal Medicine

## 2022-10-27 ENCOUNTER — Ambulatory Visit (INDEPENDENT_AMBULATORY_CARE_PROVIDER_SITE_OTHER): Payer: 59 | Admitting: Internal Medicine

## 2022-10-27 VITALS — BP 132/86 | HR 97 | Temp 98.5°F | Wt 252.0 lb

## 2022-10-27 DIAGNOSIS — J029 Acute pharyngitis, unspecified: Secondary | ICD-10-CM | POA: Diagnosis not present

## 2022-10-27 DIAGNOSIS — J069 Acute upper respiratory infection, unspecified: Secondary | ICD-10-CM | POA: Diagnosis not present

## 2022-10-27 LAB — POCT RAPID STREP A (OFFICE): Rapid Strep A Screen: NEGATIVE

## 2022-10-27 LAB — POC COVID19 BINAXNOW: SARS Coronavirus 2 Ag: NEGATIVE

## 2022-10-27 NOTE — Progress Notes (Signed)
Subjective:    Patient ID: Christy Cunningham, female    DOB: 08-03-1971, 52 y.o.   MRN: [id number]  HPI  Patient presents to clinic today with complaint of headache, runny nose and sore throat. This started 2 days.  She is blowing clear mucus out of her nose.  She is having some difficulty swallowing.  She denies nasal congestion, ear pain, cough, shortness of breath, nausea, vomiting or diarrhea.  She denies fever, chills or body aches.  She has tried TheraFlu OTC with some relief of symptoms.  She had flu 3 weeks ago, did not receive tretment.  Review of Systems     Past Medical History:  Diagnosis Date   Diabetes mellitus    Hypertension     Current Outpatient Medications  Medication Sig Dispense Refill   atorvastatin (LIPITOR) 40 MG tablet 1 tablet     BD PEN NEEDLE NANO U/F 32G X 4 MM MISC 2 (two) times daily. as directed     benzonatate (TESSALON) 100 MG capsule Take 1 capsule (100 mg total) by mouth every 8 (eight) hours. 21 capsule 0   buPROPion (WELLBUTRIN XL) 150 MG 24 hr tablet Take 1 tablet (150 mg total) by mouth daily. 90 tablet 3   busPIRone (BUSPAR) 7.5 MG tablet Take 1 tablet (7.5 mg total) by mouth 2 (two) times daily. 180 tablet 3   cetirizine (ZYRTEC) 10 MG tablet Take 1 tablet (10 mg total) by mouth daily. 30 tablet 11   Continuous Blood Gluc Receiver ([place]) [place] See admin instructions. (Patient not taking: Reported on 12/09/2021)     Continuous Blood Gluc Sensor (DEXCOM G6 SENSOR) MISC CHANGE SENSOR EVERY 10 DAYS (Patient not taking: Reported on 12/09/2021)     Continuous Blood Gluc Transmit (DEXCOM G6 TRANSMITTER) MISC See admin instructions. (Patient not taking: Reported on 12/09/2021)     Estradiol (YUVAFEM) 10 MCG TABS vaginal tablet Place 1 tablet (10 mcg total) vaginally 2 (two) times a week. 24 tablet 4   fluconazole (DIFLUCAN) 150 MG tablet Take 1 tablet (150 mg total) by mouth every 3 (three) days. 2 tablet 3   fluticasone (FLONASE) 50 MCG/ACT  nasal spray Place 2 sprays into both nostrils daily. 16 g 1   Glucagon, rDNA, (GLUCAGON EMERGENCY) 1 MG KIT See admin instructions.     HYDROcodone-acetaminophen (NORCO/VICODIN) 5-325 MG tablet Take 1-2 tablets by mouth every 4 (four) hours as needed. (Patient not taking: Reported on 12/09/2021) 10 tablet 0   lisinopril-hydrochlorothiazide (ZESTORETIC) 10-12.5 MG tablet      metFORMIN (GLUCOPHAGE-XR) 500 MG 24 hr tablet 2 tablets with evening meal     omeprazole (PRILOSEC OTC) 20 MG tablet Take 1 tablet (20 mg total) by mouth daily. 90 tablet 3   ondansetron (ZOFRAN ODT) 4 MG disintegrating tablet Take 1 tablet (4 mg total) by mouth every 8 (eight) hours as needed for nausea or vomiting. (Patient not taking: Reported on 12/09/2021) 20 tablet 0   pantoprazole (PROTONIX) 40 MG tablet 1 tablet (Patient not taking: Reported on 12/09/2021)     Semaglutide,0.25 or 0.5MG /DOS, (OZEMPIC, 0.25 OR 0.5 MG/DOSE,) 2 MG/1.5ML SOPN Inject 0.25 mg into the skin once a week.     TRESIBA FLEXTOUCH 200 UNIT/ML SOPN   0   Vitamin D, Ergocalciferol, (DRISDOL) 1.25 MG (50000 UNIT) CAPS capsule Take 50,000 Units by mouth once a week.     No current facility-administered medications for this visit.    No Known Allergies  Family History  Problem Relation Age of Onset   Healthy Mother        deceased Jun 07, 2022 MVA   Healthy Father    Diabetes Sister    Breast cancer Neg Hx     Social History   Socioeconomic History   Marital status: Married    Spouse name: Not on file   Number of children: Not on file   Years of education: Not on file   Highest education level: Not on file  Occupational History   Not on file  Tobacco Use   Smoking status: Never   Smokeless tobacco: Never  Substance and Sexual Activity   Alcohol use: Yes    Comment: socially   Drug use: No   Sexual activity: Yes    Partners: Male    Birth control/protection: Surgical    Comment: Hysterectomy, 1st intercourse 52yo  Other Topics Concern    Not on file  Social History Narrative   ** Merged History Encounter **       Social Determinants of Health   Financial Resource Strain: Not on file  Food Insecurity: Not on file  Transportation Needs: Not on file  Physical Activity: Not on file  Stress: Not on file  Social Connections: Not on file  Intimate Partner Violence: Not on file     Constitutional: Patient reports headache.  Denies fever, malaise, fatigue, or abrupt weight changes.  HEENT: Patient reports runny nose and sore throat.  Denies eye pain, eye redness, ear pain, ringing in the ears, wax buildup, nasal congestion, bloody nose. Respiratory: Denies difficulty breathing, shortness of breath, cough or sputum production.   Cardiovascular: Denies chest pain, chest tightness, palpitations or swelling in the hands or feet.  Gastrointestinal: Denies abdominal pain, bloating, constipation, diarrhea or blood in the stool.  Skin: Denies redness, rashes, lesions or ulcercations.    No other specific complaints in a complete review of systems (except as listed in HPI above).  Objective:   Physical Exam  BP 132/86 (BP Location: Right Arm, Patient Position: Sitting, Cuff Size: Large)   Pulse 97   Temp 98.5 F (36.9 C) (Oral)   Wt 252 lb (114.3 kg)   SpO2 99%   BMI 40.67 kg/m   Wt Readings from Last 3 Encounters:  06/22/22 246 lb (111.6 kg)  12/09/21 256 lb (116.1 kg)  12/05/20 260 lb (117.9 kg)    General: Appears her stated age, obese, in NAD. Skin: Warm, dry and intact. No rashes noted. HEENT: Head: normal shape and size, no sinus tenderness noted; Eyes: sclera white, no icterus, conjunctiva pink, PERRLA and EOMs intact; Throat/Mouth: Teeth present, mucosa erythematous and moist, no exudate, lesions or ulcerations noted.  Neck: No adenopathy noted. Cardiovascular: Normal rate and rhythm. S1,S2 noted.  No murmur, rubs or gallops noted.  Pulmonary/Chest: Normal effort and positive vesicular breath sounds. No  respiratory distress. No wheezes, rales or ronchi noted.  Neurological: Alert and oriented.   BMET    Component Value Date/Time   NA 135 12/05/2020 2024   K 4.1 12/05/2020 2024   CL 98 12/05/2020 2024   CO2 26 12/05/2020 2024   GLUCOSE 264 (H) 12/05/2020 2024   BUN 16 12/05/2020 2024   CREATININE 0.88 12/05/2020 2024   CREATININE 0.71 04/01/2014 1105   CALCIUM 9.0 12/05/2020 2024   GFRNONAA >60 12/05/2020 2024   GFRAA >60 08/26/2018 1542    Lipid Panel     Component Value Date/Time   CHOL 112 12/09/2021 0912   TRIG 45  12/09/2021 0912   HDL 45 12/09/2021 0912   CHOLHDL 2.5 12/09/2021 0912   CHOLHDL 2.5 09/02/2015 1405   VLDL 13 09/02/2015 1405   LDLCALC 56 12/09/2021 0912    CBC    Component Value Date/Time   WBC 11.1 (H) 12/05/2020 2024   RBC 4.90 12/05/2020 2024   HGB 16.1 (H) 12/05/2020 2024   HGB 12.0 03/15/2006 1355   HCT 46.9 (H) 12/05/2020 2024   HCT 37.3 03/15/2006 1355   PLT 415 (H) 12/05/2020 2024   PLT 455 (H) 03/15/2006 1355   MCV 95.7 12/05/2020 2024   MCV 80.3 (L) 03/15/2006 1355   MCH 32.9 12/05/2020 2024   MCHC 34.3 12/05/2020 2024   RDW 11.7 12/05/2020 2024   RDW 27.9 (H) 03/15/2006 1355   LYMPHSABS 1.4 08/26/2018 1542   LYMPHSABS 2.3 03/15/2006 1355   MONOABS 0.5 08/26/2018 1542   MONOABS 0.6 03/15/2006 1355   EOSABS 0.0 08/26/2018 1542   EOSABS 0.2 03/15/2006 1355   BASOSABS 0.0 08/26/2018 1542   BASOSABS 0.0 03/15/2006 1355    Hgb A1C Lab Results  Component Value Date   HGBA1C 7.0 12/09/2021            Assessment & Plan:   Headache, Runny Nose and Sore Throat:  Rapid strep: Negative Rapid COVID: Negative Toradol 30 mg IM x 1 Encourage rest and fluids Recommend Zyrtec and Flonase OTC as needed for symptom management No indication for antibiotics at this time  Seen for acute only, no need for follow-up and the symptoms persist or worsen Webb Silversmith, NP

## 2022-10-27 NOTE — Patient Instructions (Signed)
Sore Throat A sore throat is pain, burning, irritation, or scratchiness in the throat. When you have a sore throat, you may feel pain or tenderness in your throat when you swallow or talk. Many things can cause a sore throat, including: An infection. Seasonal allergies. Dryness in the air. Irritants, such as smoke or pollution. Radiation treatment for cancer. Gastroesophageal reflux disease (GERD). A tumor. A sore throat is often the first sign of another sickness. It may happen with other symptoms, such as coughing, sneezing, fever, and swollen neck glands. Most sore throats go away without medical treatment. Follow these instructions at home:     Medicines Take over-the-counter and prescription medicines only as told by your health care provider. Children often get sore throats. Do not give your child aspirin because of the association with Reye's syndrome. Use throat sprays to soothe your throat as told by your health care provider. Managing pain To help with pain, try: Sipping warm liquids, such as broth, herbal tea, or warm water. Eating or drinking cold or frozen liquids, such as frozen ice pops. Gargling with a mixture of salt and water 3-4 times a day or as needed. To make salt water, completely dissolve -1 tsp (3-6 g) of salt in 1 cup (237 mL) of warm water. Sucking on hard candy or throat lozenges. Putting a cool-mist humidifier in your bedroom at night to moisten the air. Sitting in the bathroom with the door closed for 5-10 minutes while you run hot water in the shower. General instructions Do not use any products that contain nicotine or tobacco. These products include cigarettes, chewing tobacco, and vaping devices, such as e-cigarettes. If you need help quitting, ask your health care provider. Rest as needed. Drink enough fluid to keep your urine pale yellow. Wash your hands often with soap and water for at least 20 seconds. If soap and water are not available, use hand  sanitizer. Contact a health care provider if: You have a fever for more than 2-3 days. You have symptoms that last for more than 2-3 days. Your throat does not get better within 7 days. You have a fever and your symptoms suddenly get worse. Get help right away if: You have difficulty breathing. You cannot swallow fluids, soft foods, or your saliva. You have increased swelling in your throat or neck. You have persistent nausea and vomiting. These symptoms may represent a serious problem that is an emergency. Do not wait to see if the symptoms will go away. Get medical help right away. Call your local emergency services (911 in the [place] Do not drive yourself to the hospital. Summary A sore throat is pain, burning, irritation, or scratchiness in the throat. Many things can cause a sore throat. Take over-the-counter medicines only as told by your health care provider. Rest as needed. Drink enough fluid to keep your urine pale yellow. Contact a health care provider if your throat does not get better within 7 days. This information is not intended to replace advice given to you by your health care provider. Make sure you discuss any questions you have with your health care provider. Document Revised: 12/30/2020 Document Reviewed: 12/30/2020 Elsevier Patient Education  2023 Elsevier Inc.  

## 2022-10-28 DIAGNOSIS — J069 Acute upper respiratory infection, unspecified: Secondary | ICD-10-CM | POA: Diagnosis not present

## 2022-10-28 DIAGNOSIS — J029 Acute pharyngitis, unspecified: Secondary | ICD-10-CM | POA: Diagnosis not present

## 2022-10-28 MED ORDER — KETOROLAC TROMETHAMINE 30 MG/ML IJ SOLN
30.0000 mg | Freq: Once | INTRAMUSCULAR | Status: AC
  Administered 2022-10-28: 30 mg via INTRAMUSCULAR

## 2022-10-28 NOTE — Addendum Note (Signed)
Addended by: Ashley Royalty E on: 10/28/2022 08:56 AM   Modules accepted: Orders

## 2022-11-02 ENCOUNTER — Emergency Department (HOSPITAL_BASED_OUTPATIENT_CLINIC_OR_DEPARTMENT_OTHER)
Admission: EM | Admit: 2022-11-02 | Discharge: 2022-11-02 | Disposition: A | Discharge status: Home / Self Care | Payer: 59 | Attending: Emergency Medicine | Admitting: Emergency Medicine

## 2022-11-02 ENCOUNTER — Other Ambulatory Visit: Payer: Self-pay

## 2022-11-02 ENCOUNTER — Encounter (HOSPITAL_BASED_OUTPATIENT_CLINIC_OR_DEPARTMENT_OTHER): Payer: Self-pay | Admitting: Emergency Medicine

## 2022-11-02 DIAGNOSIS — H9202 Otalgia, left ear: Secondary | ICD-10-CM | POA: Diagnosis present

## 2022-11-02 DIAGNOSIS — H66002 Acute suppurative otitis media without spontaneous rupture of ear drum, left ear: Secondary | ICD-10-CM | POA: Insufficient documentation

## 2022-11-02 MED ORDER — AMOXICILLIN-POT CLAVULANATE 875-125 MG PO TABS: 1.0000 | ORAL_TABLET | Freq: Two times a day (BID) | ORAL | 0 refills | Status: DC

## 2022-11-02 MED ORDER — HYDROCODONE-ACETAMINOPHEN 5-325 MG PO TABS
1.0000 | ORAL_TABLET | Freq: Once | ORAL | Status: AC
  Administered 2022-11-02: 1 via ORAL
  Filled 2022-11-02: qty 1

## 2022-11-02 MED ORDER — AMOXICILLIN-POT CLAVULANATE 875-125 MG PO TABS
1.0000 | ORAL_TABLET | Freq: Once | ORAL | Status: AC
  Administered 2022-11-02: 1 via ORAL
  Filled 2022-11-02: qty 1

## 2022-11-02 NOTE — ED Notes (Signed)
Rx x 1 given  Written and verbal inst to pt  Verbalized an understanding  To home with family  

## 2022-11-02 NOTE — ED Triage Notes (Signed)
Pt states she has an ear infection on the left side  Pt states it started about 9pm tonight    Pt is also c/o sore throat and nasal congestion

## 2022-11-02 NOTE — ED Provider Notes (Signed)
[facility]  Provider Note  CSN: [id number] Arrival date & time: 11/02/22 0146  History Chief Complaint  Patient presents with   Otalgia    Christy Cunningham is a 52 y.o. female here for left ear pain, has had URI symptoms for the last week or so, saw PCP and had neg strep test. L ear pain started tonight. No drainage. No fever. Tried motrin without improvement.    Home Medications Prior to Admission medications   Medication Sig Start Date End Date Taking? Authorizing Provider  amoxicillin-clavulanate (AUGMENTIN) 875-125 MG tablet Take 1 tablet by mouth every 12 (twelve) hours. 11/02/22  Yes Truddie Hidden, MD  atorvastatin (LIPITOR) 40 MG tablet 1 tablet 11/22/16   [provider]  BD PEN NEEDLE NANO U/F 32G X 4 MM MISC 2 (two) times daily. as directed 03/21/19   [provider]  benzonatate (TESSALON) 100 MG capsule Take 1 capsule (100 mg total) by mouth every 8 (eight) hours. 09/28/22   Chase Picket, MD  buPROPion (WELLBUTRIN XL) 150 MG 24 hr tablet Take 1 tablet (150 mg total) by mouth daily. 04/18/18   Donnamae Jude, MD  busPIRone (BUSPAR) 7.5 MG tablet Take 1 tablet (7.5 mg total) by mouth 2 (two) times daily. 06/11/19   Donnamae Jude, MD  cetirizine (ZYRTEC) 10 MG tablet Take 1 tablet (10 mg total) by mouth daily. 12/07/15   Jaynee Eagles, PA-C  Continuous Blood Gluc Receiver ([place]) [place] See admin instructions. Patient not taking: Reported on 12/09/2021 04/23/19   [provider]  Continuous Blood Gluc Sensor (DEXCOM G6 SENSOR) MISC CHANGE SENSOR EVERY 10 DAYS Patient not taking: Reported on 12/09/2021 04/24/19   [provider]  Continuous Blood Gluc Transmit (DEXCOM G6 TRANSMITTER) MISC See admin instructions. Patient not taking: Reported on 12/09/2021 04/24/19   [provider]  Estradiol (YUVAFEM) 10 MCG TABS vaginal tablet Place 1 tablet (10 mcg total) vaginally 2 (two) times a week. 06/23/22    Chrzanowski, Jami B, NP  fluconazole (DIFLUCAN) 150 MG tablet Take 1 tablet (150 mg total) by mouth every 3 (three) days. 06/22/22   Chrzanowski, Jami B, NP  fluticasone (FLONASE) 50 MCG/ACT nasal spray Place 2 sprays into both nostrils daily. 05/20/16   Archie Patten, MD  Glucagon, rDNA, (GLUCAGON EMERGENCY) 1 MG KIT See admin instructions. 09/14/20   [provider]  HYDROcodone-acetaminophen (NORCO/VICODIN) 5-325 MG tablet Take 1-2 tablets by mouth every 4 (four) hours as needed. Patient not taking: Reported on 12/09/2021 09/11/18   Raylene Everts, MD  lisinopril-hydrochlorothiazide (ZESTORETIC) 10-12.5 MG tablet  05/02/19   [provider]  metFORMIN (GLUCOPHAGE-XR) 500 MG 24 hr tablet 2 tablets with evening meal 11/17/16   [provider]  omeprazole (PRILOSEC OTC) 20 MG tablet Take 1 tablet (20 mg total) by mouth daily. 04/18/18   Donnamae Jude, MD  ondansetron (ZOFRAN ODT) 4 MG disintegrating tablet Take 1 tablet (4 mg total) by mouth every 8 (eight) hours as needed for nausea or vomiting. 12/06/20   Maudie Flakes, MD  pantoprazole (PROTONIX) 40 MG tablet  09/30/11   [provider]  Semaglutide,0.25 or 0.5MG /DOS, (OZEMPIC, 0.25 OR 0.5 MG/DOSE,) 2 MG/1.5ML SOPN Inject 0.25 mg into the skin once a week. Patient not taking: Reported on 10/27/2022 12/06/21   [provider]  TRESIBA FLEXTOUCH 200 UNIT/ML [facility]  12/12/17   [provider]  Vitamin D, Ergocalciferol, (DRISDOL) 1.25 MG (50000  UNIT) CAPS capsule Take 50,000 Units by mouth once a week. 10/29/20   [provider]     Allergies    Patient has no known allergies.   Review of Systems   Review of Systems Please see HPI for pertinent positives and negatives  Physical Exam BP (!) 164/100 (BP Location: Left Arm)   Pulse 83   Temp 98.2 F (36.8 C) (Oral)   Resp 18   Ht 5\' 6"  (1.676 m)   Wt 115.7 kg   SpO2 98%   BMI 41.16 kg/m   Physical Exam Vitals and nursing  note reviewed.  HENT:     Head: Normocephalic.     Right Ear: Tympanic membrane and ear canal normal.     Ears:     Comments: L TM with erythema and effusion, no significant bulging    Nose: Congestion present.     Mouth/Throat:     Mouth: Mucous membranes are moist.     Pharynx: Posterior oropharyngeal erythema present. No oropharyngeal exudate.  Eyes:     Extraocular Movements: Extraocular movements intact.  Pulmonary:     Effort: Pulmonary effort is normal.  Musculoskeletal:        General: Normal range of motion.     Cervical back: Neck supple.  Lymphadenopathy:     Cervical: Cervical adenopathy present.  Skin:    Findings: No rash (on exposed skin).  Neurological:     Mental Status: She is alert and oriented to person, place, and time.  Psychiatric:        Mood and Affect: Mood normal.     ED Results / Procedures / Treatments   EKG None  Procedures Procedures  Medications Ordered in the ED Medications  HYDROcodone-acetaminophen (NORCO/VICODIN) 5-325 MG per tablet 1 tablet (has no administration in time range)  amoxicillin-clavulanate (AUGMENTIN) 875-125 MG per tablet 1 tablet (has no administration in time range)    Initial Impression and Plan  Patient here with otalgia and signs of OM after recent URI, appears uncomfortable. Will give norco here for pain, begin Augmentin. Advised OTC decongestants safe for HTN. PCP follow up, RTED for any other concerns.   ED Course       MDM Rules/Calculators/A&P Medical Decision Making Problems Addressed: Acute suppurative otitis media of left ear without spontaneous rupture of tympanic membrane, recurrence not specified: acute illness or injury  Risk Prescription drug management.    Final Clinical Impression(s) / ED Diagnoses Final diagnoses:  Acute suppurative otitis media of left ear without spontaneous rupture of tympanic membrane, recurrence not specified    Rx / DC Orders ED Discharge Orders           Ordered    amoxicillin-clavulanate (AUGMENTIN) 875-125 MG tablet  Every 12 hours        11/02/22 0213             Truddie Hidden, MD 11/02/22 [phone]

## 2022-11-03 ENCOUNTER — Telehealth: Payer: 59 | Admitting: Physician Assistant

## 2022-11-03 DIAGNOSIS — T3695XA Adverse effect of unspecified systemic antibiotic, initial encounter: Secondary | ICD-10-CM | POA: Diagnosis not present

## 2022-11-03 DIAGNOSIS — B379 Candidiasis, unspecified: Secondary | ICD-10-CM

## 2022-11-03 MED ORDER — FLUCONAZOLE 150 MG PO TABS: ORAL_TABLET | ORAL | 0 refills | Status: AC

## 2022-11-03 NOTE — Progress Notes (Signed)
I have spent 5 minutes in review of e-visit questionnaire, review and updating patient chart, medical decision making and response to patient.   Jais Demir Cody Myia Bergh, PA-C    

## 2022-11-03 NOTE — Progress Notes (Signed)

## 2023-03-08 ENCOUNTER — Other Ambulatory Visit: Payer: Self-pay | Admitting: Internal Medicine

## 2023-03-08 DIAGNOSIS — Z1231 Encounter for screening mammogram for malignant neoplasm of breast: Secondary | ICD-10-CM

## 2023-03-14 ENCOUNTER — Ambulatory Visit
Admission: RE | Admit: 2023-03-14 | Discharge: 2023-03-14 | Disposition: A | Discharge status: Home / Self Care | Payer: 59 | Source: Ambulatory Visit | Attending: Internal Medicine | Admitting: Internal Medicine

## 2023-03-14 DIAGNOSIS — Z1231 Encounter for screening mammogram for malignant neoplasm of breast: Secondary | ICD-10-CM

## 2023-05-29 IMAGING — MG MM DIGITAL SCREENING BILAT W/ TOMO AND CAD
8 series · 8 of 24 positions shown · non-contrast
Comparison: Previous exam(s).

CLINICAL DATA: Screening.

EXAM:
DIGITAL SCREENING BILATERAL MAMMOGRAM WITH TOMOSYNTHESIS AND CAD
TECHNIQUE: Bilateral screening digital craniocaudal and mediolateral oblique
mammograms were obtained. Bilateral screening digital breast
tomosynthesis was performed. The images were evaluated with
computer-aided detection.

[R CC synth-2D]
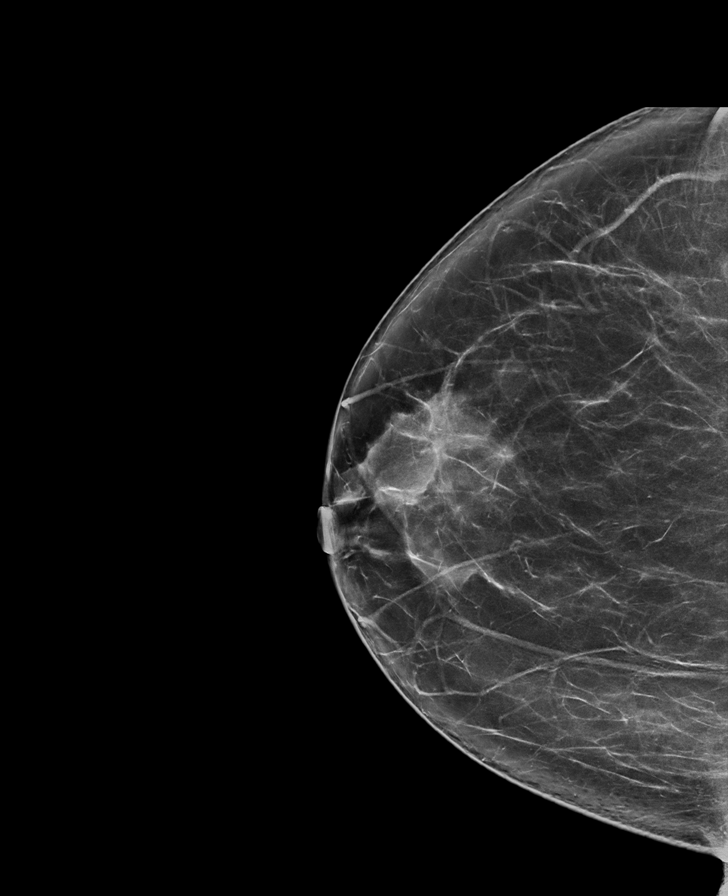

[L CC synth-2D]
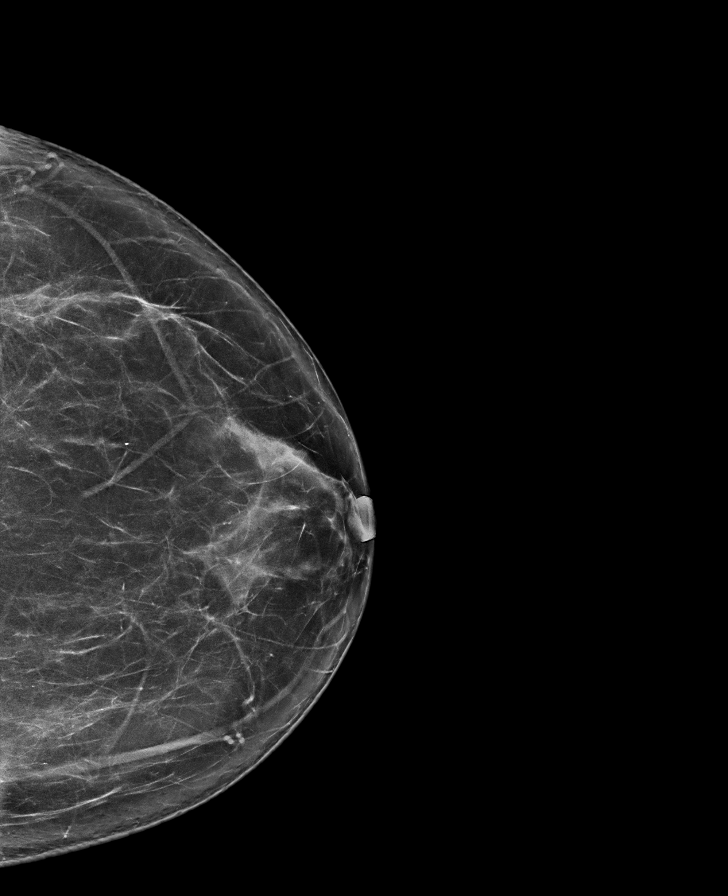

[L MLO synth-2D]
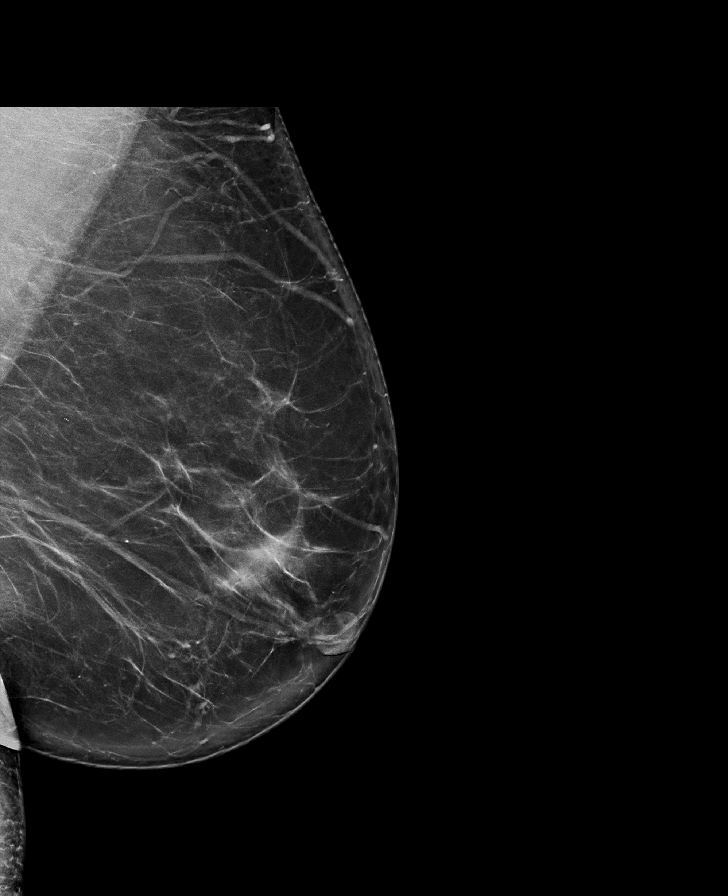

[R MLO synth-2D]
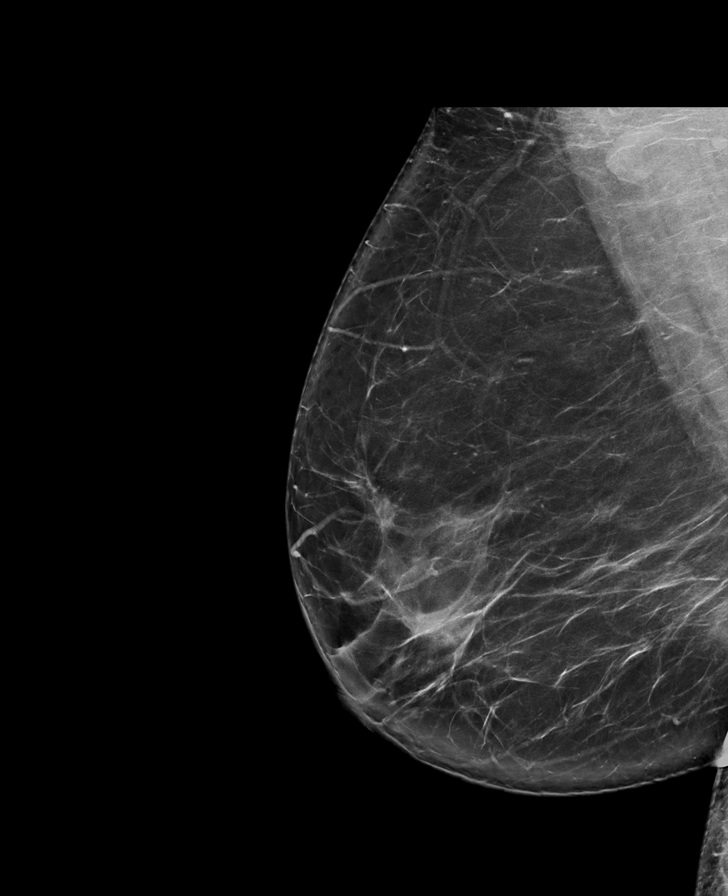

[R MLO tomo · tomo slice 49/97.0]
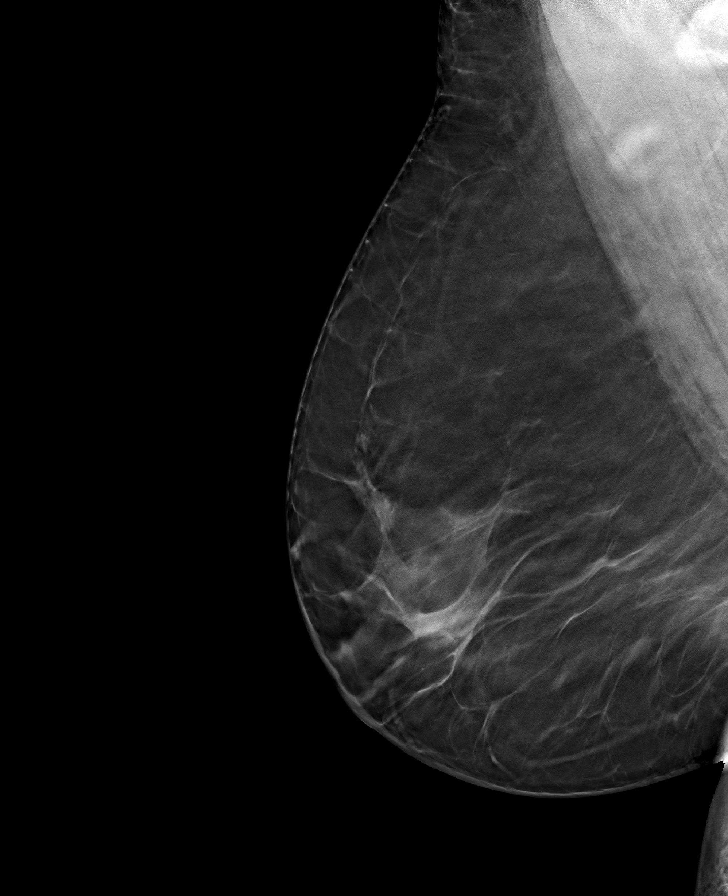

[L MLO tomo · tomo slice 48/95.0]
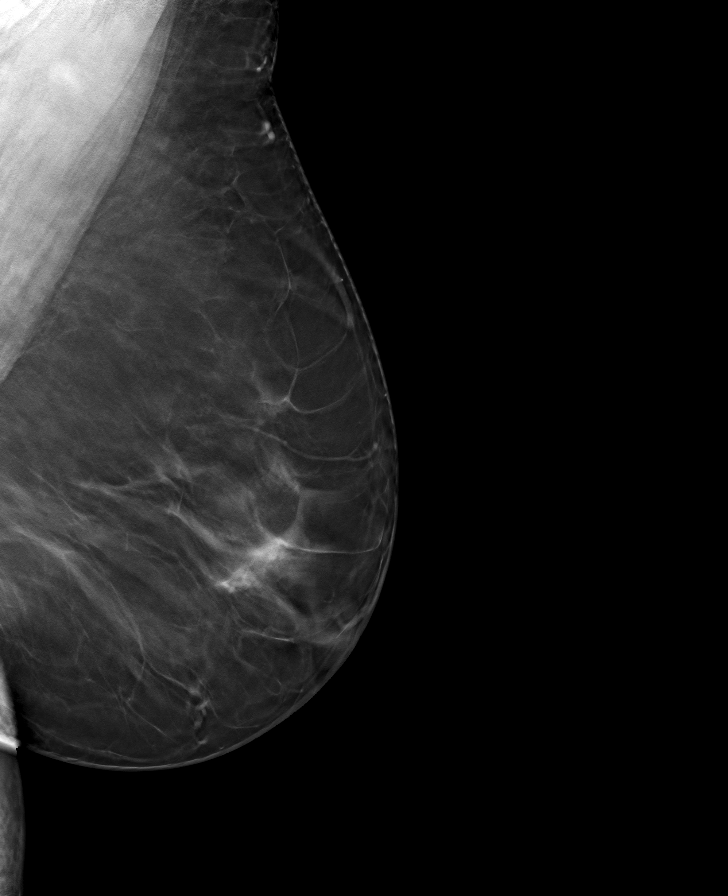

[R CC tomo · tomo slice 44/87.0]
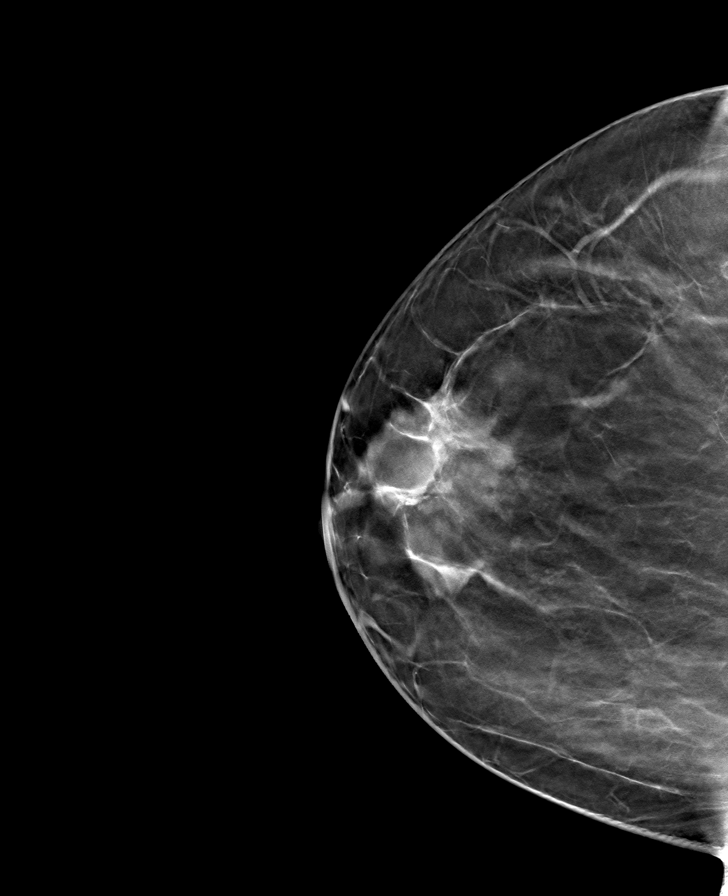

[L CC tomo · tomo slice 43/85.0]
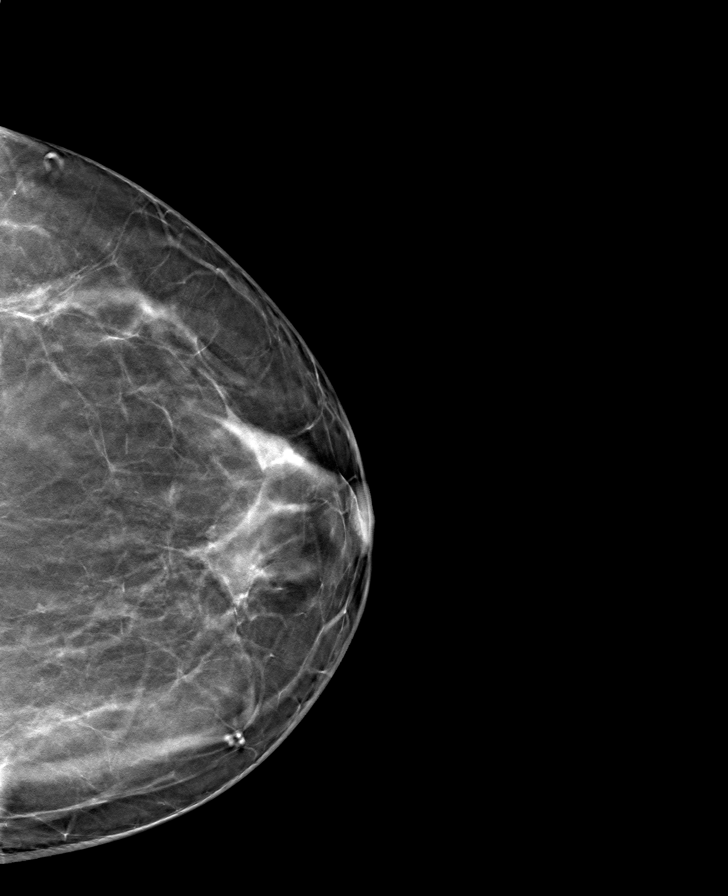

[8 of 24 positions shown; findings below may reference images not displayed]

ACR Breast Density Category b: There are scattered areas of
fibroglandular density.
FINDINGS: There are no findings suspicious for malignancy.
IMPRESSION: No mammographic evidence of malignancy. A result letter of this
screening mammogram will be mailed directly to the patient.

RECOMMENDATION:
Screening mammogram in one year. (Code:51-O-LD2)

BI-RADS CATEGORY  1: Negative.

## 2023-07-07 ENCOUNTER — Other Ambulatory Visit: Payer: Self-pay

## 2023-07-07 MED ORDER — MOUNJARO 7.5 MG/0.5ML ~~LOC~~ SOAJ
7.5000 mg | SUBCUTANEOUS | 0 refills | Status: DC
Start: 2023-07-07 — End: 2023-08-14
  Filled 2023-07-07: qty 2, 28d supply, fill #0

## 2023-08-14 ENCOUNTER — Other Ambulatory Visit: Payer: Self-pay

## 2023-08-14 MED ORDER — TIRZEPATIDE 7.5 MG/0.5ML ~~LOC~~ SOAJ
7.5000 mg | SUBCUTANEOUS | 0 refills | Status: DC
  Filled 2023-08-14: qty 2, 28d supply, fill #0
  Filled 2023-09-07: qty 2, 28d supply, fill #1

## 2023-08-15 ENCOUNTER — Other Ambulatory Visit: Payer: Self-pay

## 2023-08-24 ENCOUNTER — Other Ambulatory Visit: Payer: Self-pay

## 2023-09-07 ENCOUNTER — Other Ambulatory Visit: Payer: Self-pay

## 2023-09-26 ENCOUNTER — Other Ambulatory Visit: Payer: Self-pay

## 2023-09-27 ENCOUNTER — Other Ambulatory Visit: Payer: Self-pay

## 2023-09-27 MED ORDER — ATORVASTATIN CALCIUM 40 MG PO TABS
40.0000 mg | ORAL_TABLET | Freq: Every day | ORAL | 3 refills | Status: AC
  Filled 2023-09-27: qty 30, 30d supply, fill #0

## 2023-10-02 ENCOUNTER — Other Ambulatory Visit: Payer: Self-pay

## 2023-10-09 ENCOUNTER — Other Ambulatory Visit: Payer: Self-pay

## 2023-11-07 ENCOUNTER — Other Ambulatory Visit: Payer: Self-pay

## 2023-11-07 ENCOUNTER — Other Ambulatory Visit (HOSPITAL_COMMUNITY): Payer: Self-pay

## 2023-11-07 MED ORDER — BUSPIRONE HCL 5 MG PO TABS: ORAL_TABLET | ORAL | 5 refills | Status: AC

## 2023-11-07 MED ORDER — BAQSIMI TWO PACK 3 MG/DOSE NA POWD
NASAL | 4 refills | Status: AC
  Filled 2023-11-07: qty 1, 90d supply, fill #0

## 2023-11-07 MED ORDER — METFORMIN HCL ER 500 MG PO TB24: ORAL_TABLET | ORAL | 4 refills | Status: AC

## 2023-11-07 MED ORDER — FREESTYLE LIBRE 3 SENSOR MISC: 4 refills | Status: AC

## 2023-11-07 MED ORDER — INSULIN PEN NEEDLE 32G X 4 MM MISC
Freq: Two times a day (BID) | 4 refills | Status: AC
  Filled 2023-11-07: qty 100, 30d supply, fill #0

## 2023-11-07 MED ORDER — BUPROPION HCL ER (XL) 150 MG PO TB24: ORAL_TABLET | ORAL | 3 refills | Status: AC

## 2023-11-07 MED ORDER — GLUCAGON EMERGENCY 1 MG IJ KIT
1.0000 mg | PACK | INTRAMUSCULAR | 1 refills | Status: AC
  Filled 2023-11-07: qty 1, 1d supply, fill #0

## 2023-11-07 MED ORDER — MOUNJARO 7.5 MG/0.5ML ~~LOC~~ SOAJ
7.5000 mg | SUBCUTANEOUS | 0 refills | Status: AC
  Filled 2023-11-07: qty 2, 28d supply, fill #0

## 2023-11-07 MED ORDER — LOSARTAN POTASSIUM-HCTZ 50-12.5 MG PO TABS
1.0000 | ORAL_TABLET | Freq: Every day | ORAL | 4 refills | Status: AC
  Filled 2023-11-07: qty 30, 30d supply, fill #0

## 2023-11-07 MED ORDER — PANTOPRAZOLE SODIUM 40 MG PO TBEC: DELAYED_RELEASE_TABLET | ORAL | 0 refills | Status: AC

## 2023-11-07 MED ORDER — INSULIN DEGLUDEC 200 UNIT/ML ~~LOC~~ SOPN
70.0000 [IU] | PEN_INJECTOR | Freq: Every day | SUBCUTANEOUS | 5 refills | Status: AC
  Filled 2023-11-07 – 2023-11-29 (×2): qty 9, 25d supply, fill #0
  Filled 2024-01-04 (×2): qty 9, 25d supply, fill #1
  Filled 2024-01-24: qty 9, 25d supply, fill #2
  Filled 2024-02-21: qty 9, 25d supply, fill #3
  Filled 2024-03-18: qty 9, 25d supply, fill #4
  Filled 2024-04-16 (×2): qty 9, 25d supply, fill #5
  Filled 2024-05-07 (×2): qty 9, 25d supply, fill #6
  Filled 2024-06-01: qty 9, 25d supply, fill #7

## 2023-11-07 MED ORDER — BUPROPION HCL ER (XL) 150 MG PO TB24
ORAL_TABLET | ORAL | 5 refills | Status: AC
  Filled 2023-11-07: qty 30, 30d supply, fill #0

## 2023-11-07 MED ORDER — ATORVASTATIN CALCIUM 40 MG PO TABS
40.0000 mg | ORAL_TABLET | Freq: Every day | ORAL | 3 refills | Status: AC
  Filled 2023-11-07: qty 30, 30d supply, fill #0

## 2023-11-08 ENCOUNTER — Other Ambulatory Visit: Payer: Self-pay

## 2023-11-21 ENCOUNTER — Other Ambulatory Visit: Payer: Self-pay

## 2023-11-21 MED ORDER — MOUNJARO 10 MG/0.5ML ~~LOC~~ SOAJ
10.0000 mg | SUBCUTANEOUS | 4 refills | Status: AC
  Filled 2023-11-21: qty 2, 28d supply, fill #0
  Filled 2023-12-13: qty 2, 28d supply, fill #1
  Filled 2024-01-16: qty 2, 28d supply, fill #2
  Filled 2024-02-08: qty 2, 28d supply, fill #3
  Filled 2024-03-07 – 2024-08-21 (×2): qty 2, 28d supply, fill #4

## 2023-11-23 ENCOUNTER — Other Ambulatory Visit: Payer: Self-pay | Admitting: Internal Medicine

## 2023-11-23 DIAGNOSIS — Z1231 Encounter for screening mammogram for malignant neoplasm of breast: Secondary | ICD-10-CM

## 2023-11-29 ENCOUNTER — Other Ambulatory Visit: Payer: Self-pay

## 2023-11-30 ENCOUNTER — Other Ambulatory Visit: Payer: Self-pay

## 2023-12-13 ENCOUNTER — Other Ambulatory Visit: Payer: Self-pay

## 2023-12-14 ENCOUNTER — Other Ambulatory Visit: Payer: Self-pay

## 2024-01-04 ENCOUNTER — Other Ambulatory Visit: Payer: Self-pay

## 2024-01-16 ENCOUNTER — Other Ambulatory Visit: Payer: Self-pay

## 2024-01-24 ENCOUNTER — Other Ambulatory Visit: Payer: Self-pay

## 2024-01-30 ENCOUNTER — Other Ambulatory Visit: Payer: Self-pay

## 2024-02-08 ENCOUNTER — Other Ambulatory Visit: Payer: Self-pay

## 2024-02-21 ENCOUNTER — Other Ambulatory Visit: Payer: Self-pay

## 2024-03-01 ENCOUNTER — Other Ambulatory Visit: Payer: Self-pay

## 2024-03-01 MED ORDER — MOUNJARO 12.5 MG/0.5ML ~~LOC~~ SOAJ
12.5000 mg | SUBCUTANEOUS | 4 refills | Status: AC
Start: 2024-03-01 — End: ?
  Filled 2024-03-01 – 2024-03-07 (×2): qty 2, 28d supply, fill #0
  Filled 2024-04-02: qty 2, 28d supply, fill #1
  Filled 2024-04-29: qty 2, 28d supply, fill #2
  Filled 2024-05-28: qty 2, 28d supply, fill #3
  Filled 2024-06-20: qty 2, 28d supply, fill #4
  Filled 2024-07-18: qty 2, 28d supply, fill #5
  Filled 2024-09-13: qty 2, 28d supply, fill #6
  Filled 2024-10-09: qty 2, 28d supply, fill #7
  Filled 2024-11-07: qty 2, 28d supply, fill #8

## 2024-03-07 ENCOUNTER — Other Ambulatory Visit: Payer: Self-pay

## 2024-03-07 ENCOUNTER — Other Ambulatory Visit: Payer: Self-pay | Admitting: Medical Genetics

## 2024-03-08 ENCOUNTER — Other Ambulatory Visit: Payer: Self-pay

## 2024-03-14 ENCOUNTER — Ambulatory Visit: Payer: 59

## 2024-03-18 ENCOUNTER — Other Ambulatory Visit: Payer: Self-pay

## 2024-03-26 ENCOUNTER — Other Ambulatory Visit: Payer: Self-pay

## 2024-03-26 MED ORDER — CONTOUR NEXT TEST VI STRP
ORAL_STRIP | 4 refills | Status: AC
  Filled 2024-03-26: qty 100, 30d supply, fill #0

## 2024-04-02 ENCOUNTER — Other Ambulatory Visit: Payer: Self-pay

## 2024-04-16 ENCOUNTER — Other Ambulatory Visit: Payer: Self-pay

## 2024-04-17 ENCOUNTER — Other Ambulatory Visit: Payer: Self-pay

## 2024-04-29 ENCOUNTER — Other Ambulatory Visit: Payer: Self-pay

## 2024-05-07 ENCOUNTER — Other Ambulatory Visit: Payer: Self-pay

## 2024-05-16 ENCOUNTER — Other Ambulatory Visit: Payer: Self-pay

## 2024-05-27 ENCOUNTER — Telehealth: Admitting: Physician Assistant

## 2024-05-27 DIAGNOSIS — L03019 Cellulitis of unspecified finger: Secondary | ICD-10-CM | POA: Diagnosis not present

## 2024-05-27 MED ORDER — CEPHALEXIN 500 MG PO CAPS: 500.0000 mg | ORAL_CAPSULE | Freq: Four times a day (QID) | ORAL | 0 refills | Status: AC

## 2024-05-27 NOTE — Progress Notes (Signed)
E Visit for Cellulitis  We are sorry that you are not feeling well. Here is how we plan to help!  Based on what you shared with me it looks like you have paronychia.  I have prescribed:  Keflex 500mg  take one by mouth four times a day for 5 days  HOME CARE:  Take your medications as ordered and take all of them, even if the skin irritation appears to be healing.   GET HELP RIGHT AWAY IF:  Symptoms that don't begin to go away within 48 hours. Severe redness persists or worsens If the area turns color, spreads or swells. If it blisters and opens, develops yellow-brown crust or bleeds. You develop a fever or chills. If the pain increases or becomes unbearable.  Are unable to keep fluids and food down.  MAKE SURE YOU   Understand these instructions. Will watch your condition. Will get help right away if you are not doing well or get worse.  Thank you for choosing an e-visit.  Your e-visit answers were reviewed by a board certified advanced clinical practitioner to complete your personal care plan. Depending upon the condition, your plan could have included both over the counter or prescription medications.  Please review your pharmacy choice. Make sure the pharmacy is open so you can pick up prescription now. If there is a problem, you may contact your provider through [employer] and have the prescription routed to another pharmacy.  Your safety is important to [place]. If you have drug allergies check your prescription carefully.   For the next 24 hours you can use MyChart to ask questions about today's visit, request a non-urgent call back, or ask for a work or school excuse. You will get an email in the next two days asking about your experience. I hope that your e-visit has been valuable and will speed your recovery.  I have spent 5 minutes in review of e-visit questionnaire, review and updating patient chart, medical decision making and response to patient.   Margaretann Loveless, PA-C

## 2024-05-28 ENCOUNTER — Other Ambulatory Visit: Payer: Self-pay

## 2024-06-05 ENCOUNTER — Other Ambulatory Visit: Payer: Self-pay

## 2024-06-21 ENCOUNTER — Other Ambulatory Visit: Payer: Self-pay

## 2024-07-01 ENCOUNTER — Other Ambulatory Visit: Payer: Self-pay

## 2024-07-19 ENCOUNTER — Other Ambulatory Visit: Payer: Self-pay

## 2024-07-29 ENCOUNTER — Other Ambulatory Visit: Payer: Self-pay

## 2024-08-08 ENCOUNTER — Other Ambulatory Visit: Payer: Self-pay | Admitting: Medical Genetics

## 2024-08-08 DIAGNOSIS — Z006 Encounter for examination for normal comparison and control in clinical research program: Secondary | ICD-10-CM

## 2024-08-21 ENCOUNTER — Other Ambulatory Visit: Payer: Self-pay

## 2024-08-22 ENCOUNTER — Ambulatory Visit
Admission: RE | Admit: 2024-08-22 | Discharge: 2024-08-22 | Disposition: A | Discharge status: Home / Self Care | Source: Ambulatory Visit | Attending: Internal Medicine | Admitting: Internal Medicine

## 2024-08-22 DIAGNOSIS — Z1231 Encounter for screening mammogram for malignant neoplasm of breast: Secondary | ICD-10-CM

## 2024-08-30 ENCOUNTER — Other Ambulatory Visit: Payer: Self-pay

## 2024-10-23 ENCOUNTER — Other Ambulatory Visit: Payer: Self-pay

## 2024-10-23 ENCOUNTER — Emergency Department (HOSPITAL_BASED_OUTPATIENT_CLINIC_OR_DEPARTMENT_OTHER)
Admission: EM | Admit: 2024-10-23 | Discharge: 2024-10-23 | Disposition: A | Discharge status: Home / Self Care | Attending: Emergency Medicine | Admitting: Emergency Medicine

## 2024-10-23 ENCOUNTER — Encounter (HOSPITAL_BASED_OUTPATIENT_CLINIC_OR_DEPARTMENT_OTHER): Payer: Self-pay

## 2024-10-23 DIAGNOSIS — Z794 Long term (current) use of insulin: Secondary | ICD-10-CM | POA: Insufficient documentation

## 2024-10-23 DIAGNOSIS — R059 Cough, unspecified: Secondary | ICD-10-CM | POA: Insufficient documentation

## 2024-10-23 DIAGNOSIS — J111 Influenza due to unidentified influenza virus with other respiratory manifestations: Secondary | ICD-10-CM

## 2024-10-23 LAB — RESP PANEL BY RT-PCR (RSV, FLU A&B, COVID)  RVPGX2
Influenza A by PCR: POSITIVE — AB
Influenza B by PCR: NEGATIVE
Resp Syncytial Virus by PCR: NEGATIVE
SARS Coronavirus 2 by RT PCR: NEGATIVE

## 2024-10-23 MED ORDER — IBUPROFEN 800 MG PO TABS: 800.0000 mg | ORAL_TABLET | Freq: Once | ORAL | Status: DC

## 2024-10-23 MED ORDER — OSELTAMIVIR PHOSPHATE 75 MG PO CAPS: 75.0000 mg | ORAL_CAPSULE | Freq: Two times a day (BID) | ORAL | 0 refills | Status: AC

## 2024-10-23 NOTE — ED Provider Notes (Signed)
 " [facility] EM[facility] DEPARTMENT AT [facility] Provider Note   CSN: [id number] Arrival date & time: 10/23/24  1536     Patient presents with: Cough (Pt reports cough since Sunday accompanied with congestion, body aches, headaches, and generalized fatigue. )  HPI Christy Cunningham is a 54 y.o. female presenting for cough.  Started Sunday.  Also with nasal congestion body aches mild headaches and generalized fatigue.  Denies fever chest pain shortness of breath.  Does endorse sick contacts with similar symptoms.    Cough      Prior to Admission medications  Medication Sig Start Date End Date Taking? Authorizing Provider  oseltamivir  (TAMIFLU ) 75 MG capsule Take 1 capsule (75 mg total) by mouth every 12 (twelve) hours. 10/23/24  Yes Lang Norleen POUR, PA-C  atorvastatin  (LIPITOR) 40 MG tablet 1 tablet 11/22/16   [provider]  atorvastatin  (LIPITOR) 40 MG tablet Take 1 tablet (40 mg total) by mouth daily. 09/27/23     atorvastatin  (LIPITOR) 40 MG tablet Take 1 tablet (40 mg total) by mouth daily. 11/07/23     BD PEN NEEDLE NANO U/F 32G X 4 MM MISC 2 (two) times daily. as directed 03/21/19   [provider]  benzonatate  (TESSALON ) 100 MG capsule Take 1 capsule (100 mg total) by mouth every 8 (eight) hours. 09/28/22   LampteyAleene KIDD, MD  buPROPion  (WELLBUTRIN  XL) 150 MG 24 hr tablet Take 1 tablet (150 mg total) by mouth daily. 04/18/18   Fredirick Glenys RAMAN, MD  buPROPion  (WELLBUTRIN  XL) 150 MG 24 hr tablet Take 1 tablet in the morning Orally Once a day 30 days 11/07/23     buPROPion  (WELLBUTRIN  XL) 150 MG 24 hr tablet Take 1 tablet in the morning Oral Once a day 30 days 11/07/23     busPIRone  (BUSPAR ) 5 MG tablet Take 1 tablet Orally once a day as needed 30 days 11/07/23     busPIRone  (BUSPAR ) 7.5 MG tablet Take 1 tablet (7.5 mg total) by mouth 2 (two) times daily. 06/11/19   Fredirick Glenys RAMAN, MD  cephALEXin  (KEFLEX ) 500 MG capsule Take 1 capsule (500 mg total) by mouth 4 (four)  times daily. 05/27/24   Vivienne Delon CHRISTELLA, PA-C  cetirizine  (ZYRTEC ) 10 MG tablet Take 1 tablet (10 mg total) by mouth daily. 12/07/15   Christopher Savannah, PA-C  Continuous Blood Gluc Receiver (DEXCOM G6 RECEIVER) [place] See admin instructions. Patient not taking: Reported on 12/09/2021 04/23/19   [provider]  Continuous Blood Gluc Sensor (DEXCOM G6 SENSOR) MISC CHANGE SENSOR EVERY 10 DAYS Patient not taking: Reported on 12/09/2021 04/24/19   [provider]  Continuous Blood Gluc Transmit (DEXCOM G6 TRANSMITTER) MISC See admin instructions. Patient not taking: Reported on 12/09/2021 04/24/19   [provider]  Continuous Glucose Sensor (FREESTYLE LIBRE 3 SENSOR) MISC as directed 11/07/23   Elliot Charm, MD  Estradiol  (YUVAFEM ) 10 MCG TABS vaginal tablet Place 1 tablet (10 mcg total) vaginally 2 (two) times a week. 06/23/22   Chrzanowski, Jami B, NP  fluconazole  (DIFLUCAN ) 150 MG tablet Take 1 tablet PO once. Repeat in 3 days if needed. 11/03/22   Gladis Elsie BROCKS, PA-C  fluticasone  (FLONASE ) 50 MCG/ACT nasal spray Place 2 sprays into both nostrils daily. 05/20/16   Federico Camelia RAMAN, MD  Glucagon  (BAQSIMI  TWO PACK) 3 MG/DOSE POWD Nasally when needed for severe low blood sugar reaction 90 days 11/07/23     Glucagon , rDNA, (GLUCAGON  EMERGENCY) 1 MG KIT See admin  instructions. 09/14/20   [provider]  Glucagon , rDNA, (GLUCAGON  EMERGENCY) 1 MG KIT Inject 1 mg into the skin AS DIRECTED. 11/07/23     glucose blood (CONTOUR NEXT TEST) test strip Check blood sugar 3 times a day DX: E11.65 100 days 03/26/24     HYDROcodone -acetaminophen  (NORCO/VICODIN) 5-325 MG tablet Take 1-2 tablets by mouth every 4 (four) hours as needed. Patient not taking: Reported on 12/09/2021 09/11/18   Maranda Jamee Jacob, MD  insulin  degludec (TRESIBA ) 200 UNIT/ML FlexTouch Pen Inject 70 Units into the skin daily. 11/07/23   Varadarajan, Rupashree, MD  Insulin  Pen Needle 32G X 4 MM MISC Inject into the  skin 2 (two) times daily. 11/07/23   Varadarajan, Rupashree, MD  lisinopril-hydrochlorothiazide  (ZESTORETIC) 10-12.5 MG tablet  05/02/19   [provider]  losartan -hydrochlorothiazide  (HYZAAR ) 50-12.5 MG tablet Take 1 tablet by mouth daily. 11/07/23     metFORMIN  (GLUCOPHAGE -XR) 500 MG 24 hr tablet 2 tablets with evening meal 11/17/16   [provider]  metFORMIN  (GLUCOPHAGE -XR) 500 MG 24 hr tablet Take 2 tablets with evening meal Orally Once a day 90 days 11/07/23     omeprazole  (PRILOSEC  OTC) 20 MG tablet Take 1 tablet (20 mg total) by mouth daily. 04/18/18   Fredirick Glenys RAMAN, MD  ondansetron  (ZOFRAN  ODT) 4 MG disintegrating tablet Take 1 tablet (4 mg total) by mouth every 8 (eight) hours as needed for nausea or vomiting. 12/06/20   Theadore Ozell HERO, MD  pantoprazole  (PROTONIX ) 40 MG tablet  09/30/11   [provider]  pantoprazole  (PROTONIX ) 40 MG tablet Take 1 tablet Orally Once a day 30 day(s) 11/07/23     Semaglutide,0.25 or 0.5MG /DOS, (OZEMPIC, 0.25 OR 0.5 MG/DOSE,) 2 MG/1.5ML SOPN Inject 0.25 mg into the skin once a week. Patient not taking: Reported on 10/27/2022 12/06/21   [provider]  tirzepatide  (MOUNJARO ) 10 MG/0.5ML Pen Inject 10 mg into the skin once a week. 11/21/23     tirzepatide  (MOUNJARO ) 12.5 MG/0.5ML Pen Inject 12.5 mg into the skin once a week. 03/01/24     tirzepatide  (MOUNJARO ) 7.5 MG/0.5ML Pen Inject 7.5 mg into the skin once a week. 11/07/23     TRESIBA  FLEXTOUCH 200 UNIT/ML SOPN  12/12/17   [provider]  Vitamin D , Ergocalciferol , (DRISDOL) 1.25 MG (50000 UNIT) CAPS capsule Take 50,000 Units by mouth once a week. 10/29/20   [provider]    Allergies: Patient has no known allergies.    Review of Systems  Respiratory:  Positive for cough.     Updated Vital Signs BP (!) 131/90 (BP Location: Right Arm)   Pulse (!) 103   Temp 99.3 F (37.4 C) (Oral)   Resp 16   Ht 5' 6 (1.676 m)   Wt 97.5 kg   SpO2 98%   BMI 34.70  kg/m   Physical Exam Vitals and nursing note reviewed.  HENT:     Head: Normocephalic and atraumatic.     Mouth/Throat:     Mouth: Mucous membranes are moist.  Eyes:     General:        Right eye: No discharge.        Left eye: No discharge.     Conjunctiva/sclera: Conjunctivae normal.  Cardiovascular:     Rate and Rhythm: Normal rate and regular rhythm.     Pulses: Normal pulses.     Heart sounds: Normal heart sounds.  Pulmonary:     Effort: Pulmonary effort is normal.  Breath sounds: Normal breath sounds.  Abdominal:     General: Abdomen is flat.     Palpations: Abdomen is soft.  Skin:    General: Skin is warm and dry.  Neurological:     General: No focal deficit present.  Psychiatric:        Mood and Affect: Mood normal.     (all labs ordered are listed, but only abnormal results are displayed) Labs Reviewed  RESP PANEL BY RT-PCR (RSV, FLU A&B, COVID)  [id number] - Abnormal; Notable for the following components:      Result Value   Influenza A by PCR POSITIVE (*)    All other components within normal limits    EKG: None  Radiology: No results found.   Procedures   Medications Ordered in the ED  ibuprofen  (ADVIL ) tablet 800 mg (has no administration in time range)                                    Medical Decision Making  54 year old well-appearing female presenting for flulike symptoms.  Exam was unremarkable.  She looks well and nontoxic.  Intermittently tachycardic but otherwise hemodynamically stable.  Respiratory panel shows she has the flu.  Likely the etiology of her symptoms.  Advised supportive care at home.  Did send Tamiflu  to her pharmacy but discussed potential adverse effects should she decide to take it.  Discussed pertinent return precautions.  Advised PCP follow-up if her symptoms persisted.  Discharged good condition.     Final diagnoses:  Flu    ED Discharge Orders          Ordered    oseltamivir  (TAMIFLU ) 75 MG capsule  Every  12 hours        10/23/24 2041               Dorell Gatlin K, PA-C 10/23/24 2041    Dreama Longs, MD 10/24/24 1216  "

## 2024-10-23 NOTE — Discharge Instructions (Addendum)
 Evaluation today revealed that he had the flu.  This likely explains your symptoms.  Treatment is supportive which includes rest, hydration with water and Gatorade and you can take Tylenol  and ibuprofen  you as you needed.  He can also take over-the-counter cough medicine.  Also sent Tamiflu  to your pharmacy but it is still optional.  I will warn you that it could make you vomit.  Please follow-up with your PCP if your symptoms persist.  If your symptoms worsen, you have shortness of breath, lethargy, cannot tolerate fluid intake or any other concerning symptom please return to the ED for further evaluation.

## 2024-11-07 ENCOUNTER — Other Ambulatory Visit: Payer: Self-pay

## 2024-11-11 ENCOUNTER — Other Ambulatory Visit: Payer: Self-pay
